# Patient Record
Sex: Female | Born: 1975 | Race: White | Hispanic: No | Smoking: Current every day smoker
Health system: Southern US, Community
[De-identification: ages and names within clinical notes are randomized; demographics above are authoritative.]

## PROBLEM LIST (undated history)

## (undated) DIAGNOSIS — M549 Dorsalgia, unspecified: Secondary | ICD-10-CM

## (undated) DIAGNOSIS — R569 Unspecified convulsions: Secondary | ICD-10-CM

## (undated) DIAGNOSIS — Z91148 Patient's other noncompliance with medication regimen for other reason: Secondary | ICD-10-CM

## (undated) DIAGNOSIS — Z9114 Patient's other noncompliance with medication regimen: Secondary | ICD-10-CM

## (undated) DIAGNOSIS — G8929 Other chronic pain: Secondary | ICD-10-CM

## (undated) DIAGNOSIS — Q282 Arteriovenous malformation of cerebral vessels: Secondary | ICD-10-CM

## (undated) DIAGNOSIS — C801 Malignant (primary) neoplasm, unspecified: Secondary | ICD-10-CM

## (undated) HISTORY — PX: TUBAL LIGATION: SHX77

## (undated) HISTORY — PX: ABDOMINAL HYSTERECTOMY: SHX81

## (undated) HISTORY — PX: BRAIN SURGERY: SHX531

---

## 2000-09-10 ENCOUNTER — Encounter: Payer: Self-pay | Admitting: Emergency Medicine

## 2000-09-10 ENCOUNTER — Emergency Department (HOSPITAL_COMMUNITY): Admission: EM | Admit: 2000-09-10 | Discharge: 2000-09-10 | Payer: Self-pay | Admitting: Emergency Medicine

## 2001-03-03 ENCOUNTER — Emergency Department (HOSPITAL_COMMUNITY): Admission: EM | Admit: 2001-03-03 | Discharge: 2001-03-03 | Payer: Self-pay | Admitting: *Deleted

## 2001-04-01 ENCOUNTER — Emergency Department (HOSPITAL_COMMUNITY): Admission: EM | Admit: 2001-04-01 | Discharge: 2001-04-01 | Payer: Self-pay | Admitting: Emergency Medicine

## 2001-10-22 ENCOUNTER — Emergency Department (HOSPITAL_COMMUNITY): Admission: EM | Admit: 2001-10-22 | Discharge: 2001-10-22 | Payer: Self-pay | Admitting: Emergency Medicine

## 2002-02-14 ENCOUNTER — Emergency Department (HOSPITAL_COMMUNITY): Admission: EM | Admit: 2002-02-14 | Discharge: 2002-02-14 | Payer: Self-pay | Admitting: Emergency Medicine

## 2002-02-16 ENCOUNTER — Encounter: Payer: Self-pay | Admitting: Emergency Medicine

## 2002-02-17 ENCOUNTER — Encounter: Payer: Self-pay | Admitting: Internal Medicine

## 2002-02-17 ENCOUNTER — Observation Stay (HOSPITAL_COMMUNITY): Admission: EM | Admit: 2002-02-17 | Discharge: 2002-02-18 | Payer: Self-pay | Admitting: Emergency Medicine

## 2002-07-05 ENCOUNTER — Emergency Department (HOSPITAL_COMMUNITY): Admission: EM | Admit: 2002-07-05 | Discharge: 2002-07-06 | Payer: Self-pay | Admitting: *Deleted

## 2002-07-06 ENCOUNTER — Encounter: Payer: Self-pay | Admitting: *Deleted

## 2002-10-04 ENCOUNTER — Observation Stay (HOSPITAL_COMMUNITY): Admission: EM | Admit: 2002-10-04 | Discharge: 2002-10-04 | Payer: Self-pay | Admitting: Emergency Medicine

## 2003-03-27 ENCOUNTER — Emergency Department (HOSPITAL_COMMUNITY): Admission: EM | Admit: 2003-03-27 | Discharge: 2003-03-27 | Payer: Self-pay | Admitting: *Deleted

## 2003-05-26 ENCOUNTER — Emergency Department (HOSPITAL_COMMUNITY): Admission: EM | Admit: 2003-05-26 | Discharge: 2003-05-26 | Payer: Self-pay | Admitting: Emergency Medicine

## 2003-05-26 ENCOUNTER — Emergency Department (HOSPITAL_COMMUNITY): Admission: EM | Admit: 2003-05-26 | Discharge: 2003-05-26 | Payer: Self-pay | Admitting: *Deleted

## 2003-07-13 ENCOUNTER — Emergency Department (HOSPITAL_COMMUNITY): Admission: EM | Admit: 2003-07-13 | Discharge: 2003-07-13 | Payer: Self-pay | Admitting: Emergency Medicine

## 2005-06-16 ENCOUNTER — Emergency Department (HOSPITAL_COMMUNITY): Admission: EM | Admit: 2005-06-16 | Discharge: 2005-06-16 | Payer: Self-pay | Admitting: Emergency Medicine

## 2005-07-03 ENCOUNTER — Emergency Department (HOSPITAL_COMMUNITY): Admission: EM | Admit: 2005-07-03 | Discharge: 2005-07-03 | Payer: Self-pay | Admitting: Emergency Medicine

## 2005-11-10 ENCOUNTER — Emergency Department (HOSPITAL_COMMUNITY): Admission: EM | Admit: 2005-11-10 | Discharge: 2005-11-10 | Payer: Self-pay | Admitting: Emergency Medicine

## 2006-01-09 ENCOUNTER — Emergency Department (HOSPITAL_COMMUNITY): Admission: EM | Admit: 2006-01-09 | Discharge: 2006-01-09 | Payer: Self-pay | Admitting: Emergency Medicine

## 2006-01-20 ENCOUNTER — Inpatient Hospital Stay (HOSPITAL_COMMUNITY): Admission: EM | Admit: 2006-01-20 | Discharge: 2006-01-23 | Payer: Self-pay | Admitting: Emergency Medicine

## 2006-02-24 ENCOUNTER — Emergency Department (HOSPITAL_COMMUNITY): Admission: EM | Admit: 2006-02-24 | Discharge: 2006-02-24 | Payer: Self-pay | Admitting: Emergency Medicine

## 2006-03-07 ENCOUNTER — Emergency Department (HOSPITAL_COMMUNITY): Admission: EM | Admit: 2006-03-07 | Discharge: 2006-03-07 | Payer: Self-pay | Admitting: Emergency Medicine

## 2006-03-08 ENCOUNTER — Emergency Department (HOSPITAL_COMMUNITY): Admission: EM | Admit: 2006-03-08 | Discharge: 2006-03-08 | Payer: Self-pay | Admitting: Emergency Medicine

## 2006-04-02 ENCOUNTER — Emergency Department (HOSPITAL_COMMUNITY): Admission: EM | Admit: 2006-04-02 | Discharge: 2006-04-02 | Payer: Self-pay | Admitting: Emergency Medicine

## 2006-04-25 ENCOUNTER — Emergency Department (HOSPITAL_COMMUNITY): Admission: EM | Admit: 2006-04-25 | Discharge: 2006-04-25 | Payer: Self-pay | Admitting: Emergency Medicine

## 2006-05-04 ENCOUNTER — Emergency Department (HOSPITAL_COMMUNITY): Admission: EM | Admit: 2006-05-04 | Discharge: 2006-05-04 | Payer: Self-pay | Admitting: Emergency Medicine

## 2006-05-20 ENCOUNTER — Emergency Department (HOSPITAL_COMMUNITY): Admission: EM | Admit: 2006-05-20 | Discharge: 2006-05-20 | Payer: Self-pay | Admitting: Emergency Medicine

## 2006-06-09 ENCOUNTER — Emergency Department (HOSPITAL_COMMUNITY): Admission: EM | Admit: 2006-06-09 | Discharge: 2006-06-09 | Payer: Self-pay | Admitting: Emergency Medicine

## 2006-06-25 ENCOUNTER — Emergency Department (HOSPITAL_COMMUNITY): Admission: EM | Admit: 2006-06-25 | Discharge: 2006-06-25 | Payer: Self-pay | Admitting: Emergency Medicine

## 2006-07-06 ENCOUNTER — Emergency Department (HOSPITAL_COMMUNITY): Admission: EM | Admit: 2006-07-06 | Discharge: 2006-07-07 | Payer: Self-pay | Admitting: Emergency Medicine

## 2006-07-14 ENCOUNTER — Emergency Department (HOSPITAL_COMMUNITY): Admission: EM | Admit: 2006-07-14 | Discharge: 2006-07-14 | Payer: Self-pay | Admitting: Emergency Medicine

## 2006-09-18 ENCOUNTER — Emergency Department (HOSPITAL_COMMUNITY): Admission: EM | Admit: 2006-09-18 | Discharge: 2006-09-18 | Payer: Self-pay | Admitting: Emergency Medicine

## 2006-11-08 ENCOUNTER — Emergency Department (HOSPITAL_COMMUNITY): Admission: EM | Admit: 2006-11-08 | Discharge: 2006-11-08 | Payer: Self-pay | Admitting: Emergency Medicine

## 2006-12-08 ENCOUNTER — Emergency Department (HOSPITAL_COMMUNITY): Admission: EM | Admit: 2006-12-08 | Discharge: 2006-12-08 | Payer: Self-pay | Admitting: Emergency Medicine

## 2006-12-20 ENCOUNTER — Emergency Department (HOSPITAL_COMMUNITY): Admission: EM | Admit: 2006-12-20 | Discharge: 2006-12-20 | Payer: Self-pay | Admitting: Emergency Medicine

## 2007-01-07 ENCOUNTER — Emergency Department (HOSPITAL_COMMUNITY): Admission: EM | Admit: 2007-01-07 | Discharge: 2007-01-07 | Payer: Self-pay | Admitting: Emergency Medicine

## 2007-02-06 ENCOUNTER — Emergency Department (HOSPITAL_COMMUNITY): Admission: EM | Admit: 2007-02-06 | Discharge: 2007-02-06 | Payer: Self-pay | Admitting: Emergency Medicine

## 2007-02-15 ENCOUNTER — Emergency Department (HOSPITAL_COMMUNITY): Admission: EM | Admit: 2007-02-15 | Discharge: 2007-02-15 | Payer: Self-pay | Admitting: Emergency Medicine

## 2007-02-28 ENCOUNTER — Emergency Department (HOSPITAL_COMMUNITY): Admission: EM | Admit: 2007-02-28 | Discharge: 2007-02-28 | Payer: Self-pay | Admitting: Emergency Medicine

## 2007-03-24 ENCOUNTER — Emergency Department (HOSPITAL_COMMUNITY): Admission: EM | Admit: 2007-03-24 | Discharge: 2007-03-24 | Payer: Self-pay | Admitting: Emergency Medicine

## 2007-05-10 ENCOUNTER — Emergency Department (HOSPITAL_COMMUNITY): Admission: EM | Admit: 2007-05-10 | Discharge: 2007-05-10 | Payer: Self-pay | Admitting: Emergency Medicine

## 2007-11-27 ENCOUNTER — Emergency Department (HOSPITAL_COMMUNITY): Admission: EM | Admit: 2007-11-27 | Discharge: 2007-11-28 | Payer: Self-pay | Admitting: Emergency Medicine

## 2008-01-01 ENCOUNTER — Emergency Department (HOSPITAL_COMMUNITY): Admission: EM | Admit: 2008-01-01 | Discharge: 2008-01-01 | Payer: Self-pay | Admitting: Emergency Medicine

## 2008-01-27 ENCOUNTER — Emergency Department (HOSPITAL_COMMUNITY): Admission: EM | Admit: 2008-01-27 | Discharge: 2008-01-27 | Payer: Self-pay | Admitting: Emergency Medicine

## 2008-02-07 ENCOUNTER — Emergency Department (HOSPITAL_COMMUNITY): Admission: EM | Admit: 2008-02-07 | Discharge: 2008-02-07 | Payer: Self-pay | Admitting: Emergency Medicine

## 2008-03-05 ENCOUNTER — Emergency Department (HOSPITAL_COMMUNITY): Admission: EM | Admit: 2008-03-05 | Discharge: 2008-03-05 | Payer: Self-pay | Admitting: Emergency Medicine

## 2008-03-09 ENCOUNTER — Emergency Department (HOSPITAL_COMMUNITY): Admission: EM | Admit: 2008-03-09 | Discharge: 2008-03-09 | Payer: Self-pay | Admitting: Emergency Medicine

## 2008-03-11 ENCOUNTER — Emergency Department (HOSPITAL_COMMUNITY): Admission: EM | Admit: 2008-03-11 | Discharge: 2008-03-11 | Payer: Self-pay | Admitting: Emergency Medicine

## 2008-04-08 ENCOUNTER — Emergency Department (HOSPITAL_COMMUNITY): Admission: EM | Admit: 2008-04-08 | Discharge: 2008-04-08 | Payer: Self-pay | Admitting: Emergency Medicine

## 2008-07-19 ENCOUNTER — Emergency Department (HOSPITAL_COMMUNITY): Admission: EM | Admit: 2008-07-19 | Discharge: 2008-07-19 | Payer: Self-pay | Admitting: Emergency Medicine

## 2008-11-20 ENCOUNTER — Emergency Department (HOSPITAL_COMMUNITY): Admission: EM | Admit: 2008-11-20 | Discharge: 2008-11-20 | Payer: Self-pay | Admitting: Emergency Medicine

## 2008-12-26 ENCOUNTER — Emergency Department (HOSPITAL_COMMUNITY): Admission: EM | Admit: 2008-12-26 | Discharge: 2008-12-26 | Payer: Self-pay | Admitting: Emergency Medicine

## 2008-12-28 ENCOUNTER — Emergency Department (HOSPITAL_COMMUNITY): Admission: EM | Admit: 2008-12-28 | Discharge: 2008-12-28 | Payer: Self-pay | Admitting: Emergency Medicine

## 2009-01-08 ENCOUNTER — Emergency Department (HOSPITAL_COMMUNITY): Admission: EM | Admit: 2009-01-08 | Discharge: 2009-01-08 | Payer: Self-pay | Admitting: Emergency Medicine

## 2009-05-02 ENCOUNTER — Emergency Department (HOSPITAL_COMMUNITY): Admission: EM | Admit: 2009-05-02 | Discharge: 2009-05-02 | Payer: Self-pay | Admitting: Emergency Medicine

## 2009-09-29 ENCOUNTER — Emergency Department (HOSPITAL_COMMUNITY): Admission: EM | Admit: 2009-09-29 | Discharge: 2009-09-29 | Payer: Self-pay | Admitting: Emergency Medicine

## 2009-11-07 ENCOUNTER — Emergency Department (HOSPITAL_COMMUNITY): Admission: EM | Admit: 2009-11-07 | Discharge: 2009-11-07 | Payer: Self-pay | Admitting: Emergency Medicine

## 2009-12-26 ENCOUNTER — Emergency Department (HOSPITAL_COMMUNITY): Admission: EM | Admit: 2009-12-26 | Discharge: 2009-12-26 | Payer: Self-pay | Admitting: Emergency Medicine

## 2010-03-27 ENCOUNTER — Emergency Department (HOSPITAL_COMMUNITY): Admission: EM | Admit: 2010-03-27 | Discharge: 2010-03-27 | Payer: Self-pay | Admitting: Emergency Medicine

## 2010-07-10 LAB — CBC
Hemoglobin: 14.8 g/dL (ref 12.0–15.0)
MCH: 35.2 pg — ABNORMAL HIGH (ref 26.0–34.0)
MCHC: 36.1 g/dL — ABNORMAL HIGH (ref 30.0–36.0)
MCV: 97.4 fL (ref 78.0–100.0)
RBC: 4.21 MIL/uL (ref 3.87–5.11)

## 2010-07-10 LAB — DIFFERENTIAL
Basophils Relative: 1 % (ref 0–1)
Lymphocytes Relative: 21 % (ref 12–46)
Lymphs Abs: 1.5 10*3/uL (ref 0.7–4.0)
Monocytes Relative: 10 % (ref 3–12)
Neutro Abs: 4.6 10*3/uL (ref 1.7–7.7)
Neutrophils Relative %: 66 % (ref 43–77)

## 2010-07-10 LAB — POCT I-STAT, CHEM 8
Chloride: 106 mEq/L (ref 96–112)
Creatinine, Ser: 1 mg/dL (ref 0.4–1.2)
Hemoglobin: 15 g/dL (ref 12.0–15.0)
Potassium: 4.8 mEq/L (ref 3.5–5.1)
Sodium: 138 mEq/L (ref 135–145)

## 2010-07-10 LAB — URINALYSIS, ROUTINE W REFLEX MICROSCOPIC
Bilirubin Urine: NEGATIVE
Hgb urine dipstick: NEGATIVE
Ketones, ur: NEGATIVE mg/dL
Specific Gravity, Urine: 1.015 (ref 1.005–1.030)
Urobilinogen, UA: 0.2 mg/dL (ref 0.0–1.0)

## 2010-07-11 LAB — URINE CULTURE

## 2010-07-11 LAB — DIFFERENTIAL
Basophils Absolute: 0 10*3/uL (ref 0.0–0.1)
Basophils Relative: 0 % (ref 0–1)
Eosinophils Relative: 2 % (ref 0–5)
Monocytes Absolute: 0.8 10*3/uL (ref 0.1–1.0)

## 2010-07-11 LAB — CBC
HCT: 40.4 % (ref 36.0–46.0)
MCHC: 33.9 g/dL (ref 30.0–36.0)
MCV: 102.7 fL — ABNORMAL HIGH (ref 78.0–100.0)
RDW: 17.5 % — ABNORMAL HIGH (ref 11.5–15.5)

## 2010-07-11 LAB — URINALYSIS, ROUTINE W REFLEX MICROSCOPIC
Ketones, ur: NEGATIVE mg/dL
Nitrite: NEGATIVE
Protein, ur: NEGATIVE mg/dL
pH: 5.5 (ref 5.0–8.0)

## 2010-07-11 LAB — RAPID URINE DRUG SCREEN, HOSP PERFORMED
Amphetamines: NOT DETECTED
Benzodiazepines: POSITIVE — AB

## 2010-07-11 LAB — BASIC METABOLIC PANEL
BUN: 11 mg/dL (ref 6–23)
CO2: 27 mEq/L (ref 19–32)
Chloride: 103 mEq/L (ref 96–112)
Glucose, Bld: 113 mg/dL — ABNORMAL HIGH (ref 70–99)
Potassium: 3.6 mEq/L (ref 3.5–5.1)

## 2010-07-11 LAB — URINE MICROSCOPIC-ADD ON

## 2010-07-11 LAB — PREGNANCY, URINE: Preg Test, Ur: NEGATIVE

## 2010-07-27 ENCOUNTER — Emergency Department (HOSPITAL_COMMUNITY): Payer: Medicaid Other

## 2010-07-27 ENCOUNTER — Emergency Department (HOSPITAL_COMMUNITY)
Admission: EM | Admit: 2010-07-27 | Discharge: 2010-07-27 | Disposition: A | Discharge status: Home / Self Care | Payer: Medicaid Other | Attending: Emergency Medicine | Admitting: Emergency Medicine

## 2010-07-27 DIAGNOSIS — R569 Unspecified convulsions: Secondary | ICD-10-CM | POA: Insufficient documentation

## 2010-07-27 DIAGNOSIS — IMO0002 Reserved for concepts with insufficient information to code with codable children: Secondary | ICD-10-CM | POA: Insufficient documentation

## 2010-07-27 DIAGNOSIS — G40909 Epilepsy, unspecified, not intractable, without status epilepticus: Secondary | ICD-10-CM | POA: Insufficient documentation

## 2010-07-27 DIAGNOSIS — W1809XA Striking against other object with subsequent fall, initial encounter: Secondary | ICD-10-CM | POA: Insufficient documentation

## 2010-07-27 DIAGNOSIS — Y92009 Unspecified place in unspecified non-institutional (private) residence as the place of occurrence of the external cause: Secondary | ICD-10-CM | POA: Insufficient documentation

## 2010-07-27 DIAGNOSIS — S335XXA Sprain of ligaments of lumbar spine, initial encounter: Secondary | ICD-10-CM | POA: Insufficient documentation

## 2010-07-27 LAB — BASIC METABOLIC PANEL
BUN: 8 mg/dL (ref 6–23)
CO2: 27 mEq/L (ref 19–32)
Calcium: 9.2 mg/dL (ref 8.4–10.5)
Creatinine, Ser: 1.06 mg/dL (ref 0.4–1.2)
GFR calc Af Amer: >60 mL/min (ref >60)
Glucose, Bld: 91 mg/dL (ref 70–99)

## 2010-07-27 LAB — DIFFERENTIAL
Basophils Relative: 0 % (ref 0–1)
Lymphs Abs: 1.5 10*3/uL (ref 0.7–4.0)
Monocytes Absolute: 1 10*3/uL (ref 0.1–1.0)
Monocytes Relative: 9 % (ref 3–12)
Neutro Abs: 9 10*3/uL — ABNORMAL HIGH (ref 1.7–7.7)

## 2010-07-27 LAB — CBC
Hemoglobin: 15.3 g/dL — ABNORMAL HIGH (ref 12.0–15.0)
MCH: 34.8 pg — ABNORMAL HIGH (ref 26.0–34.0)
MCHC: 35 g/dL (ref 30.0–36.0)
MCV: 99.3 fL (ref 78.0–100.0)

## 2010-08-04 LAB — VALPROIC ACID LEVEL: Valproic Acid Lvl: 83 ug/mL (ref 50.0–100.0)

## 2010-08-30 ENCOUNTER — Emergency Department (HOSPITAL_COMMUNITY)
Admission: EM | Admit: 2010-08-30 | Discharge: 2010-08-30 | Disposition: A | Discharge status: Home / Self Care | Payer: Medicaid Other | Attending: Emergency Medicine | Admitting: Emergency Medicine

## 2010-08-30 DIAGNOSIS — M549 Dorsalgia, unspecified: Secondary | ICD-10-CM | POA: Insufficient documentation

## 2010-08-30 DIAGNOSIS — R569 Unspecified convulsions: Secondary | ICD-10-CM | POA: Insufficient documentation

## 2010-09-13 ENCOUNTER — Emergency Department (HOSPITAL_COMMUNITY): Payer: Medicaid Other

## 2010-09-13 ENCOUNTER — Emergency Department (HOSPITAL_COMMUNITY)
Admission: EM | Admit: 2010-09-13 | Discharge: 2010-09-13 | Disposition: A | Discharge status: Home / Self Care | Payer: Medicaid Other | Attending: Emergency Medicine | Admitting: Emergency Medicine

## 2010-09-13 DIAGNOSIS — R569 Unspecified convulsions: Secondary | ICD-10-CM | POA: Insufficient documentation

## 2010-09-13 DIAGNOSIS — M549 Dorsalgia, unspecified: Secondary | ICD-10-CM | POA: Insufficient documentation

## 2010-09-13 DIAGNOSIS — G8929 Other chronic pain: Secondary | ICD-10-CM | POA: Insufficient documentation

## 2010-09-13 NOTE — H&P (Signed)
NAMELASHANN, HAGG                 ACCOUNT NO.:  [id number]   MEDICAL RECORD NO.:  [id number]          PATIENT TYPE:  INP   LOCATION:  IC09                          FACILITY:  [facility]   PHYSICIAN:  Osvaldo Shipper, MD     DATE OF BIRTH:  1975-11-25   DATE OF ADMISSION:  01/20/2006  DATE OF DISCHARGE:  LH                                HISTORY & PHYSICAL   The patient does not have a primary medical doctor.   ADMITTING DIAGNOSES:  1. Valproic acid toxicity.  2. Drug overdose, likely intentional.  3. History of suicidal attempts in the past.  4. History of seizure disorder.   CHIEF COMPLAINT:  Unresponsiveness.   HISTORY OF PRESENT ILLNESS:  The patient is a 35 year old Caucasian female  who has a history of seizure disorder and a history of multiple episodes of  suicidal attempts in the past for which she has required inpatient  psychiatric therapy.  She also carries a diagnosis of depression and seizure  disorder.  The patient is very somnolent, unable to give me any history at  this time.   According to the ED physician, the patient took valproic acid yesterday, the  exact quantity is unknown.  This morning she was found by her boyfriend to  be lethargic and somnolent and acting strangely.  According to the  boyfriend, she might have also had a seizure though he is not very sure.  The patient was subsequently brought into the ED for further evaluation.   The patient was unable to give my any history whatsoever at this time.   HOME MEDICATIONS:  Depakote, unknown dose and Tylenol as needed.   ALLERGIES:  NO KNOWN DRUG ALLERGIES.   PAST MEDICAL HISTORY:  Obtained from previous records and boyfriend  1. History of depression with suicidal attempts in the past.  2. History of anxiety disorder.  3. History of seizure disorder for which she is on Depakote.   SURGICAL HISTORY:  Apparently includes tubal ligation, otherwise none is  mentioned in the chart.   SOCIAL HISTORY:   Lives with her boyfriend.  The rest of the social history  is unable to be obtained.   FAMILY HISTORY:  Unable to be done at this time.   REVIEW OF SYSTEMS:  Unable to be done at this time.   PHYSICAL EXAMINATION:  VITAL SIGNS:  Temperature is 98.1, blood pressure is  120/64, heart rate in the 70s, respiratory rate is about 18, saturation 97%  on room air.  GENERAL:  This is an obese, overweight white female, very somnolent, arouses  on calling her name but then goes right back to sleep.  HEENT:  There is no pallor or icterus.  Oral mucous membranes are moist.  No  oral lesions are noted.  Pupils are equal, reactive to light.  NECK:  Soft and supple.  LUNGS:  Clear to auscultation anteriorly.  CARDIOVASCULAR:  S1 S2 is normal regular.  No murmurs appreciated.  ABDOMEN:  Obese, nontender, nondistended.  EXTREMITIES:  Without edema.  Peripheral pulses are palpable.  NEUROLOGIC:  No focal  neurologic deficits are appreciated.  She did manage  to put her tongue out which was in the midline.   LABORATORY DATA:  Her CBC shows a hemoglobin of 11.3, MCV is 83, platelet  count is 143, other parameters are normal.  B-MET is normal.  LFTs are  unremarkable.  Albumin is 3.1.  Calcium is 8.3.  Urine pregnancy test is  negative.  Acetaminophen level is 53.  Valproic acid level is 446.  Urine  drug screen is positive for cocaine.  Alcohol level less than 5.   No admitting studies have been done.   IMPRESSION:  This is a 35 year old Caucasian female with medical problems as  stated above with a history of suicidal attempts in the past who presents  with a drug overdose and found to have a toxic valproic acid level.  Till  otherwise known, we will treat this as a possible suicidal attempt.  She  also has a slightly high acetaminophen level.  According to the patient's  boyfriend, the patient does not have a lot of Tylenol at home and he does  not think that she took a lot of Tylenol.  Her LFTs are  not elevated at this  time.   PLAN:  1. Valproic acid toxicity likely intentional.  Most of the treatments      supportive.  We will check an ammonia level.  We will monitor the      patient's neurologic status closely in the intensive care unit.      Patients who have toxic valproic acid level are prone to cerebral edema      which we will keep in our mind at this time.  As of now, the patient      does not seem to have any focal deficits and she is able to awake on      calling her name but then she goes right back to sleep.  Valproic acid      level will be monitored closely.  2. Elevated acetaminophen level.  We will check on the level now.  We will      monitor LFTs and acetaminophen level in the morning as well.  I do not      think that this level represents an acute Tylenol toxicity.  If her      LFTs trend upward, we will consider treatment with Mucomyst.  3. History of seizure disorder.  We will continue to monitor the patient      in the ICU.  4. Once the patient is medically stable, she will need to be evaluated at      the behavioral health center.      Osvaldo Shipper, MD  Electronically Signed     GK/MEDQ  D:  01/20/2006  T:  01/20/2006  Job:  [id number]

## 2010-09-13 NOTE — Discharge Summary (Signed)
[name], [name]                           ACCOUNT NO.:  [id number]   MEDICAL RECORD NO.:  [id number]                   PATIENT TYPE:  OBV   LOCATION:  A213                                 FACILITY:  [facility]   PHYSICIAN:  [name], M.D.           DATE OF BIRTH:  [birth year]   DATE OF ADMISSION:  02/16/2002  DATE OF DISCHARGE:  02/18/2002                                 DISCHARGE SUMMARY   DISCHARGE DIAGNOSES:  1. Chest pain, gastrointestinal related.     a. Erosive esophagitis.  2. Schatzki's ring not manipulated.  3. Normal gallbladder.  4. Anemia.  5. Severe iron deficiency with ferratin of 6. Iron studies less than 10.     Possible acute superimposed on chronic iron deficiency.  6. Polysubstance abuse.  7. Symptomatic left nephrolithiasis on ultrasound.   DISPOSITION:  The patient discharged to home after proceeding directly to  mental health emergency services for intensive outpatient rehab.   MEDICATIONS:  1. Iron 325 mg b.i.d.  2. Aciphex 20 mg daily.  3. Hydrocodone/APAP 5/500 t.i.d. p.r.n.  The patient  is given a     prescription for #10 with no refills.  The patient  instructed not to     take any Aleve, Advil, aspirin or any other over-the-counter pain     medications aside from Tylenol.  The patient  strongly encouraged to     proceed with mental health for polysubstance abuse.  4. Multivitamin with minerals one daily such as Theragran N.   FOLLOW UP:  The patient  has [facility] and assigned to [address].  Arrangements made to follow up with Dr. Donnalee [name].  Follow up with Dr. Benard [name] in six to eight weeks for possible colonoscopy to  complete GI workup.  Followup with mental health as noted above.   HOSPITAL COURSE:  The patient  is a 35 year old healthy female with a  history of polysubstance abuse who presented to the emergency room  with  chest pain.  On further evaluation it was found that this was most likely GI  related by  history.  Though her chest pain resolved, she complained of  intermittent right upper quadrant pain and ultrasound revealed no findings  with the gallbladder.  She did have an asymptomatic left kidney stone of 8  mm at the upper pole of the kidney which was asymptomatic.  The patient  was  endoscoped on 10/24 per Dr. Benard [name] uneventfully.  Findings revealed erosive  esophagitis.  No frank ulceration and a Schatzki's ring.  There was no  evidence of Barrett's esophagus.  She had a patent pylorus and the duodenum  was okay.  She did have some retained food in her stomach which made the  exam somewhat difficult, but no frank ulcers were seen.  The patient was  noted to be anemic with hemoglobin of 8.1 at the time of admission with an  MCV of 62.  Iron studies subsequently revealed severe iron deficiency and  though her hemoglobin dropped to 7.1 post hydration, she did not have any  evidence of decompensation. It was felt that she could be discharged safely  home without transfusion on iron therapy.  This was discussed with patient  and her mother.  The mother seems to have a reasonable understanding.  The  patient  had very limited insight despite multiple attempts to explain the  findings and concerns that were noted during this study.  Followup is  arranged as above.  At the time of discharge awake, alert female in no  distress.  Blood pressure  is 123/56, pulse is 74 and regular.  Respirations  are unlabored and lungs are clear to auscultation.  Has a soft nontender no  mass.  Bowel sounds are present.  Extremities without clubbing, cyanosis or  edema.  Neurologic status was nonfocal.  She has somewhat unusual affect,  but certainly does not appear to be compromised.   ASSESSMENT/PLAN:  As noted above.                                               [name], M.D.    FED/MEDQ  D:  02/18/2002  T:  02/19/2002  Job:  [id number]

## 2010-09-13 NOTE — Discharge Summary (Signed)
   [name], [name]                           ACCOUNT NO.:  [id number]   MEDICAL RECORD NO.:  [id number]                   PATIENT TYPE:  OBV   LOCATION:  IC03                                 FACILITY:  [facility]   PHYSICIAN:  Sarita Bottom, M.D.                  DATE OF BIRTH:  01-02-1976   DATE OF ADMISSION:  10/04/2002  DATE OF DISCHARGE:  10/04/2002                                 DISCHARGE SUMMARY   This 35 year old lady with a history of depression. She was admitted this  morning to [facility] after she reportedly took 14 pills of Paxil  in an attempt to calm herself and commit suicide.  The patient was admitted  to the [facility] intensive care unit for observation.  Her  clinical course was essentially unremarkable.  _______was called to see the  patient and after evaluation they recommended that the patient could be  transferred to [facility] Psychiatric Department.   The patient's physical examination was essentially unremarkable at the time  of discharge.   The patient will therefore be discharged to [facility]  Psychiatric Department.  For details of patient's illness refer to patient's  H&P dictated area.  The patient's discharge condition is stable and  satisfactory.                                               Sarita Bottom, M.D.    DW/MEDQ  D:  10/04/2002  T:  10/05/2002  Job:  [id number]

## 2010-09-13 NOTE — Discharge Summary (Signed)
[name], [name]                 ACCOUNT NO.:  [id number]   MEDICAL RECORD NO.:  [id number]          PATIENT TYPE:  INP   LOCATION:  A211                          FACILITY:  [facility]   PHYSICIAN:  Mobolaji B. Bakare, M.D.DATE OF BIRTH:  05-23-1975   DATE OF ADMISSION:  01/20/2006  DATE OF DISCHARGE:  09/28/2007LH                                 DISCHARGE SUMMARY   PRIMARY CARE PHYSICIAN:  Unassigned psychiatrist.   FINAL DIAGNOSES:  1. Valproic acid toxicity.  2. Probable suicidal attempt.  3. History of seizure disorder.  4. Cocaine abuse.   BRIEF HISTORY:  [name] is a 35 year old Caucasian female with history of  seizures and mental illness.  She has attempted suicide in the past.  She  presented to the Emergency Room unresponsive on the day of admission. We  could gather that the patient took valproic acid and quantity was unknown.  When the patient became more alert and awake she stated that she did not  intend to kill herself and she forgot she had used the medication.  She was  found by her boyfriend to be lethargic and acting strangely.  He thought she  might have had a seizure.  He subsequently brought her to the Emergency  Room.  On initial evaluation, the patient's vital signs were 98, temperature  98.1, blood pressure 120/65, heart rate of 70, O2 sat of 97%.  The patient  was without neurological deficit.  She was able to respond to obey commands  minimally, however she was very somnolent but arousable.  Initial laboratory  data revealed valproic acid of 446.  We tried four times (more than four  times upper limits of normal).  She had urine drug screen which was also  positive for cocaine.  Alcohol level was 5.   The patient was admitted to the intensive care unit and was monitored.  She  was also noted to have acetaminophen level of 53.   HOSPITAL COURSE:  As the patient was monitored closely, valproic acid level  was monitored and this trended downwards.  By the  third day of admission,  valproic acid was less than 50.  The patient was more awake, alert and  wanted to go home.  She was earlier seen during admission by the ACT team  and the disposition was to admit to [facility].  Subsequent  evaluation revealed that the patient had capacity to make decisions and she  is willing to follow-up at Mental Health Unit.  She is no more at risk to  herself or to anybody.  The patient again reported to the ACT team that she  had been drinking the night that she took the medication so she could not  remember how many Depakote she took.  She stated she has no reason to harm  herself.  She is found not to be psychotic.  The patient has an appointment  at [facility] on the 25th of October 2007 at 8:30 a.m.  She was admonished to follow-up with this appointment.   LAB WORK:  1.  The patient was started back on valproic acid and she had to take an      extra dose on the day of discharge.  Follow-up valproic acid was 81      which is within therapeutic range.  She is stable for discharge to      follow-up with mental health physicians.  2. Tylenol overdose.  The patient follow-up level of Tylenol was less than      10.  She did not require any acetylcysteine.  3. Seizure disorder.  The patient did not have any seizure during this      hospitalization.   PROCEDURE:  Head CT scan showed punctate areas of calcification noted in the  left temporal lobe.  This has remained stable over several years.   DISCHARGE MEDICATIONS:  Depakote 500 mg three tablets nightly, Lortab  7.5/500 p.o. q. 4 to 6 hours p.r.n..  The patient was taking this before at  home.  There was no prescription written for her.      Mobolaji B. Corky Downs, M.D.  Electronically Signed     MBB/MEDQ  D:  01/23/2006  T:  01/26/2006  Job:  [id number]

## 2010-09-13 NOTE — H&P (Signed)
NAME:  [name], [name] NO.:  [id number]   MEDICAL RECORD NO.:  [id number]                   PATIENT TYPE:   LOCATION:                                       FACILITY:   PHYSICIAN:  Sarita Bottom, M.D.                  DATE OF BIRTH:   DATE OF ADMISSION:  DATE OF DISCHARGE:                                HISTORY & PHYSICAL   PRIMARY MD:  Dr. Lodema Hong.   CHIEF COMPLAINT:  I took an overdose.   HISTORY OF PRESENT ILLNESS:  [name] is a 35 year old lady with a  history of depression and previous suicide attempt recently started on Paxil  for depression.  Patient took 14 pills of Paxil this morning around 4 a.m.  after which her companion called the police who had to struggle with the  patient to bring her to the emergency room.  In the emergency room the  patient was treated with activated charcoal.  She admits to trying to commit  suicide by taking these tablets of Paxil and she said this event was  precipitated by recent stressful events in her life.   REVIEW OF SYSTEMS:  She denies any fever, recent weight loss.  RESPIRATORY:  She admits to cough off and on.  She complains of shortness of breath.  She  admits to palpitations off and on.  Admits to bilateral chest pain.  Denies  any diarrhea.  She complains of vomiting once this morning.  Denies any  dizziness or headache.  All other systems reviewed and they are negative.   PAST MEDICAL HISTORY:  1. Significant for depression.  2. She has a history of suicide attempts in the past.  3. She has a history of anxiety disorder.  4. She has a history of tubal ligation.   CURRENT MEDICATIONS:  1. Include Paxil.  2. Xanax.   ALLERGIES:  She claims to be allergic to PENICILLIN but she is not sure what  her reaction.   FAMILY HISTORY:  Her Dad is on hemodialysis.  There is no family history of  depression.   SOCIAL HISTORY:  She is separated.  She currently lives with a companion who  she  says is an older man, sugar daddy.  She has two kids who are currently  with her sister.  She smokes about one half pack of cigarettes per day.  Does not drink alcohol but uses crack cocaine.  She is unemployed.   PHYSICAL EXAMINATION:  VITAL SIGNS:  Blood pressure 148/98, heart rate 90,  temperature 97.2 degrees Farenheit.  GENERAL:  This is a young lady, small stature, she looks slightly groggy.  HEENT:  She is not pale.  She is anicteric.  Her oral mucosa is moist.  Her  tongue is pierced.  NECK:  She has a scar on the right lateral aspect of her neck.  No  jugular  venous distention or lymphadenopathy or thyromegaly.  CHEST:  Clear to auscultation.  CV:  Heart sounds S1 and S2 are normal.  Rhythm is regular.  No murmurs were  heard.  ABDOMEN:  Benign.  CNS:  She is awake.  Appears slightly groggy.  She has no focal neurologic  deficit.  She is well oriented to name, place and person.  EXTREMITIES:  She has no edema.   LAB DIAGNOSTIC:  Salicylate level is less than 4.0.  Acetaminophen level is  less than 10.  Alcohol level is less than 5.  Sodium is 134, potassium is  3.6, chloride is 106, CO2 is 27, BUN is 10, creatinine is 0.9, glucose is  115.  Urine drug screen is positive for cocaine and cannabinoids.  Hemoglobin is 13.7, WBC 7.5, MCV is 80.8 , platelet count is 150.   ASSESSMENT/PLAN:  1. Acute drug overdose.  Patient will be admitted to intensive care unit for     observation and monitoring.  She was put on a one-to-one  observation.  She will be given oxygen via nasal cannula at 2 L per minute.  1. Depression with suicidal ideation.  I will call Dr. Quintella Reichert assessment and     for possible transfer to [facility].  2. Substance abuse.  Patient will require counseling and detoxification as     an outpatient.  3. Complaint of chest pain.  I will schedule cardiac enzymes and a 12 lead     electrocardiogram.  4. Patient to be admitted under the hospitalist service.   Further work up     and management will depend on clinical course.                                               Sarita Bottom, M.D.    DW/MEDQ  D:  10/04/2002  T:  10/05/2002  Job:  [id number]

## 2010-09-13 NOTE — Op Note (Signed)
NAMEWYLENE, WEISSMAN                           ACCOUNT NO.:  [id number]   MEDICAL RECORD NO.:  [id number]                   PATIENT TYPE:  OBV   LOCATION:  A213                                 FACILITY:  [facility]   PHYSICIAN:  R. Roetta Sessions, M.D.              DATE OF BIRTH:  1975/05/04   DATE OF PROCEDURE:  02/18/2002  DATE OF DISCHARGE:                                 OPERATIVE REPORT   PROCEDURE:  Diagnostic esophagogastroduodenoscopy.   ENDOSCOPIST:  Gerrit Friends. Rourk, M.D.   INDICATIONS FOR PROCEDURE:  The patient is a 35 year old lady admitted to  the hospital with abdominal chest pain in the setting of polysubstance  abuse.  She has a marked microcytic anemia.  EGD is now being done to  further evaluate her chest and abdominal pain.  This  approach has been  discussed with the patient.  The potential risks, benefits, and alternatives  have been reviewed; and questions answered.  Please see my dictated H&P, ASA  II.   DESCRIPTION OF PROCEDURE:  O2 saturation, blood pressure, pulse and  respirations were monitored throughout the entire procedure.   CONSCIOUS SEDATION:  Versed 5 mg IV, Demerol 125 mg IV.   INSTRUMENT:  Olympus video chip adult gastroscope.   FINDINGS:  Examination of the tubular esophagus revealed multiple distal  esophageal erosions. There were four linear erosions in 4-quadrant  distribution approximately 45 cm in length.  There was a noncritical  Schatzki ring.  There was no evidence of Barrett's esophagus.  The EG  junction was easily traversed.   STOMACH:  The gastric cavity had some retained food within it.  It  insufflated well with air.  A thorough examination of the gastric mucosa  including a retroflex view of the proximal stomach and esophagogastric  junction demonstrated a small hiatal hernia and the Schatzki ring was again  noted.  I was unable to identify any erosion or ulcer crater.  I moved the  patient from the left lateral decubitus to  supine trying to move the food  out of the way.  We attempted to wash it.  We could not totally get a  complete view of the body of the stomach because of retained food.  However,  most of the stomach was seen and the gastric mucosa otherwise appeared  normal.  The pylorus was patent and easily traversed.   DUODENUM:  The bulb and the second portion appeared normal.   THERAPY/DIAGNOSTIC MANEUVERS:  None.   The patient tolerated the procedure well and was reacted in endoscopy.   IMPRESSION:  1. Prominent distal esophageal erosions consistent with moderately severe     erosive reflux esophagitis.  2. Noncritical Schatzki ring, not manipulated.  3. Small hiatal hernia.  4. Retained food precluded complete examination of the stomach. The stomach     that was seen appeared normal.  5. Normal D1 and D2.  DISCUSSION:  I suspect that erosive reflux esophagitis is the likely  contributing factor to her recent chest pain.   RECOMMENDATIONS:  1. We continue Protonix 40 mg orally indefinitely.  2. She is proven to, in fact, be iron deficit.  Would recommend a     colonoscopy as an outpatient to complete her GI evaluation.                                               Jonathon Bellows, M.D.    RMR/MEDQ  D:  02/18/2002  T:  02/18/2002  Job:  [id number]   cc:   Hanley Hays. Dechurch, M.D.  [address]  [place]  [place] [address]  Fax: [phone]
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
## 2010-09-13 NOTE — Consult Note (Signed)
[name], [name]                           ACCOUNT NO.:  [id number]   MEDICAL RECORD NO.:  [id number]                   PATIENT TYPE:  OBV   LOCATION:  A213                                 FACILITY:  [facility]   PHYSICIAN:  R. Roetta Sessions, M.D.              DATE OF BIRTH:  04-16-1976   DATE OF CONSULTATION:  02/17/2002  DATE OF DISCHARGE:                                   CONSULTATION   REASON FOR CONSULTATION:  Chest and abdominal pain.   HISTORY OF PRESENT ILLNESS:  This patient is a 35 year old lady who was  admitted to the hospital February 16, 2002, complaining of the recent onset  of intermittent right-sided chest pain and right upper quadrant abdominal  pain over the past week, which was worse the day that she came to the  emergency department.  She was brought to the hospital by EMS.  She had not  had any nausea or vomiting, no fever or chills, no change in bowel habits,  no melena or rectal bleeding.  She denies reflux symptoms, odynophagia, or  dysphagia.  She has had over a 30-pound weight loss over the past few years.  She recently was consuming large amounts of alcohol and uses crack cocaine  and marijuana as well.  Admission hemoglobin was 8.1 and MCV of 64.  She has  remained stable thus far while hospitalized.  She has been treated with IV  Protonix.  Empirically, ultrasound of the abdomen today demonstrated a  small, contracted gallbladder with thickening of the gallbladder wall,  probably not abnormal for the small size of the gallbladder.  Otherwise,  there were no significant abnormalities.  A repeat H & H was 7.3 and 24.3  today. She has not been transfused.  She is tolerating her anemia very well.  She has refused hemoccult testing. Her white count was 7,600 on admission.  AST and ALT 12 and 12.  ALT 64.  Total bilirubin 0.5, sodium 135, potassium  3.8, chloride 105, CO2 27, glucose 95, BUN 7, creatinine 0.7, calcium 8.7,  total protein 6.4, albumin 3.6.  Iron  studies were pending.  She has been  seen by the rehab team.  She has remained afebrile and hemodynamically  stable.  A chest x-ray was negative on admission.  EKG demonstrated sinus  rhythm.  The patient states she has somewhat heavy menstrual cycles every  month.   PAST MEDICAL HISTORY:  History of bronchitis.  History of COPD.   PAST SURGICAL HISTORY:  Tubal ligation.   CURRENT MEDICATIONS:  None.   ALLERGIES:  PENICILLIN.   HABITS:  Polysubstance abuse.   FAMILY HISTORY:  Noncontributory.   SOCIAL HISTORY:  The patient has three children.  She lives with her father.  She lives with her boyfriend.  She is unemployed.  Substance abuse as noted  above.   REVIEW OF SYSTEMS:  As in the history  of present illness.   PHYSICAL EXAMINATION:  GENERAL:  Reveals a pleasant 35 year old lady who is  somewhat disinterested in her current situation.  VITAL SIGNS: Temperature 96.5, pulse 90, respiratory rate 20, BP 129/69.  SKIN:  Warm.  No jaundice.  LUNGS:  Clear to auscultation.  CARDIAC:  Regular rate and rhythm without murmur, gallop, or rub.  ABDOMEN:  Nondistended with positive bowel sounds. She has some minimal  right upper tenderness to palpation.  No appreciable mass or organomegaly.  EXTREMITIES:  No edema.   IMPRESSION:  This patient is a 35 year old lady admitted to the hospital  with somewhat nonspecific chest and abdominal discomfort in a setting of  polysubstance abuse.   She does not have any apparent life threatening issues at this point in time  and no obvious gallbladder disease, gastroesophageal reflux disease, or  peptic ulcer disease.  I would be more concerned about an acute chest pain  syndrome secondary to crack cocaine use recently.  I do think it would be  worthwhile to survey her upper GI tract in the way of performing further  limited evaluation while she is in here the in hospital.  I have offered  this procedure tomorrow morning.  The potential risks,  benefits, and  alternatives have been reviewed and questions answered.  She is agreeable.   She does have a significant microcytic anemia which she seems to be  tolerating very well at this time.  We will make further recommendations  based on the findings of EGD tomorrow.  Her H & H will be repeated tomorrow  morning.   I have discussed my recommendations with Dr. Elesa Hacker at the nurse's station  earlier today.   I would like to thank Dr. Elesa Hacker for letting me see this lady today.                                               Jonathon Bellows, M.D.    RMR/MEDQ  D:  02/17/2002  T:  02/17/2002  Job:  [id number]   cc:   Elesa Hacker, MD

## 2010-11-03 ENCOUNTER — Emergency Department (HOSPITAL_COMMUNITY)
Admission: EM | Admit: 2010-11-03 | Discharge: 2010-11-03 | Disposition: A | Discharge status: Home / Self Care | Payer: Medicaid Other | Attending: Emergency Medicine | Admitting: Emergency Medicine

## 2010-11-03 ENCOUNTER — Encounter: Payer: Self-pay | Admitting: Emergency Medicine

## 2010-11-03 DIAGNOSIS — S20229A Contusion of unspecified back wall of thorax, initial encounter: Secondary | ICD-10-CM | POA: Insufficient documentation

## 2010-11-03 DIAGNOSIS — T148XXA Other injury of unspecified body region, initial encounter: Secondary | ICD-10-CM

## 2010-11-03 DIAGNOSIS — F17200 Nicotine dependence, unspecified, uncomplicated: Secondary | ICD-10-CM | POA: Insufficient documentation

## 2010-11-03 DIAGNOSIS — W19XXXA Unspecified fall, initial encounter: Secondary | ICD-10-CM | POA: Insufficient documentation

## 2010-11-03 HISTORY — DX: Unspecified convulsions: R56.9

## 2010-11-03 MED ORDER — HYDROCODONE-ACETAMINOPHEN 5-325 MG PO TABS: 2.0000 | ORAL_TABLET | ORAL | Status: AC | PRN

## 2010-11-03 MED ORDER — METHOCARBAMOL 500 MG PO TABS: 500.0000 mg | ORAL_TABLET | Freq: Four times a day (QID) | ORAL | Status: AC

## 2010-11-03 NOTE — ED Notes (Signed)
PA in with pt at this time  

## 2010-11-03 NOTE — ED Notes (Signed)
Pt slipped and fell x 2 days ago and now c/o lower back pain, rates pain 10. otc meds without relief. Denies hitting head or LOC. NAD

## 2010-11-03 NOTE — ED Provider Notes (Signed)
History     Chief Complaint  Patient presents with  . Fall   Patient is a 35 y.o. female presenting with fall. The history is provided by the patient.  Fall The accident occurred 2 days ago. The fall occurred while walking. Distance fallen: from standing position. She landed on a hard floor. There was no blood loss. Point of impact: buttocks. Pain location: lower back. The pain is at a severity of 10/10. The pain is moderate. She was ambulatory at the scene. There was no entrapment after the fall. There was no drug use involved in the accident. Pertinent negatives include no fever, no numbness, no abdominal pain, no vomiting, no hematuria, no headaches and no tingling. The symptoms are aggravated by standing and ambulation. She has tried NSAIDs for the symptoms. The treatment provided no relief.    Past Medical History  Diagnosis Date  . Seizures     History reviewed. No pertinent past surgical history.  History reviewed. No pertinent family history.  History  Substance Use Topics  . Smoking status: Current Everyday Smoker    Types: Cigarettes  . Smokeless tobacco: Not on file  . Alcohol Use: No    OB History    Grav Para Term Preterm Abortions TAB SAB Ect Mult Living                  Review of Systems  Constitutional: Negative for fever.  HENT: Negative.   Eyes: Negative.   Respiratory: Negative.   Cardiovascular: Negative.   Gastrointestinal: Negative for vomiting and abdominal pain.  Genitourinary: Negative for dysuria and hematuria.        No incontinence  Musculoskeletal: Positive for back pain.  Skin: Negative.   Neurological: Negative for tingling, weakness, numbness and headaches.    Physical Exam  BP 124/68  Pulse 77  Temp(Src) 97.9 F (36.6 C) (Oral)  Resp 16  Ht 5\' 6"  (1.676 m)  Wt 180 lb (81.647 kg)  BMI 29.05 kg/m2  SpO2 98%  LMP 11/03/2010  Physical Exam  Constitutional: She is oriented to person, place, and time. Vital signs are normal. She  appears well-developed and well-nourished.  Non-toxic appearance.  HENT:  Head: Normocephalic and atraumatic.  Eyes: Conjunctivae are normal. Pupils are equal, round, and reactive to light.  Neck: Normal range of motion. Neck supple.  Cardiovascular: Normal rate, regular rhythm and normal heart sounds.   Pulmonary/Chest: Effort normal and breath sounds normal.  Abdominal: Soft. There is no tenderness. There is no rebound and no guarding.  Musculoskeletal: She exhibits tenderness.       Lumbar back: She exhibits tenderness, swelling and pain. She exhibits normal range of motion and normal pulse.       Arms:      ttp of the left  lumbar paraspinal muscles  Lymphadenopathy:    She has no cervical adenopathy.  Neurological: She is alert and oriented to person, place, and time. Coordination normal.  Skin: Skin is warm and dry.    ED Course  Procedures  MDM Patient is ambulatory, no focal neuro deficits.  DP pulses are strong bilaterally      Tammy L. [place], [place] 11/03/10 1447  Evaluation and management were performed by NP/PA under my supervision/collaboration.  Taraneh Metheney B.   Taheem Fricke B. Bernette Mayers, MD 11/05/10 [phone]

## 2010-11-05 NOTE — ED Provider Notes (Signed)
Evaluation and management were performed by NP/PA under my supervision/collaboration.  Dacy Enrico B.   Britley Gashi B. Bernette Mayers, MD 11/05/10 [phone]

## 2011-01-14 ENCOUNTER — Other Ambulatory Visit: Payer: Self-pay | Admitting: Neurology

## 2011-01-14 DIAGNOSIS — R569 Unspecified convulsions: Secondary | ICD-10-CM

## 2011-01-22 ENCOUNTER — Ambulatory Visit (HOSPITAL_COMMUNITY)
Admission: RE | Admit: 2011-01-22 | Discharge: 2011-01-22 | Disposition: A | Discharge status: Home / Self Care | Payer: Medicaid Other | Source: Ambulatory Visit | Attending: Neurology | Admitting: Neurology

## 2011-01-22 DIAGNOSIS — R569 Unspecified convulsions: Secondary | ICD-10-CM | POA: Insufficient documentation

## 2011-01-22 MED ORDER — GADOBENATE DIMEGLUMINE 529 MG/ML IV SOLN: 17.0000 mL | Freq: Once | INTRAVENOUS | Status: AC | PRN

## 2011-02-04 LAB — BASIC METABOLIC PANEL
CO2: 28
GFR calc Af Amer: >60
GFR calc non Af Amer: >60
Glucose, Bld: 170 — ABNORMAL HIGH
Potassium: 3.7
Sodium: 139

## 2011-03-26 ENCOUNTER — Encounter (HOSPITAL_COMMUNITY): Payer: Self-pay | Admitting: *Deleted

## 2011-03-26 ENCOUNTER — Emergency Department (HOSPITAL_COMMUNITY): Payer: Medicaid Other

## 2011-03-26 ENCOUNTER — Emergency Department (HOSPITAL_COMMUNITY)
Admission: EM | Admit: 2011-03-26 | Discharge: 2011-03-26 | Disposition: A | Discharge status: Home / Self Care | Payer: Medicaid Other | Attending: Emergency Medicine | Admitting: Emergency Medicine

## 2011-03-26 DIAGNOSIS — M25539 Pain in unspecified wrist: Secondary | ICD-10-CM | POA: Insufficient documentation

## 2011-03-26 DIAGNOSIS — R569 Unspecified convulsions: Secondary | ICD-10-CM | POA: Insufficient documentation

## 2011-03-26 DIAGNOSIS — F17200 Nicotine dependence, unspecified, uncomplicated: Secondary | ICD-10-CM | POA: Insufficient documentation

## 2011-03-26 DIAGNOSIS — T7411XA Adult physical abuse, confirmed, initial encounter: Secondary | ICD-10-CM | POA: Insufficient documentation

## 2011-03-26 DIAGNOSIS — M549 Dorsalgia, unspecified: Secondary | ICD-10-CM | POA: Insufficient documentation

## 2011-03-26 MED ORDER — HYDROCODONE-ACETAMINOPHEN 5-325 MG PO TABS
2.0000 | ORAL_TABLET | Freq: Once | ORAL | Status: AC
  Administered 2011-03-26: 2 via ORAL
  Filled 2011-03-26: qty 2

## 2011-03-26 MED ORDER — IBUPROFEN 800 MG PO TABS: 800.0000 mg | ORAL_TABLET | Freq: Three times a day (TID) | ORAL | Status: AC

## 2011-03-26 MED ORDER — HYDROCODONE-ACETAMINOPHEN 5-325 MG PO TABS: 1.0000 | ORAL_TABLET | ORAL | Status: AC | PRN

## 2011-03-26 MED ORDER — ACETAMINOPHEN 500 MG PO TABS
1000.0000 mg | ORAL_TABLET | Freq: Once | ORAL | Status: AC
  Administered 2011-03-26: 1000 mg via ORAL
  Filled 2011-03-26: qty 2
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
## 2011-04-04 ENCOUNTER — Emergency Department (HOSPITAL_COMMUNITY)
Admission: EM | Admit: 2011-04-04 | Discharge: 2011-04-04 | Discharge status: Against Medical Advice | Payer: Medicaid Other | Attending: Emergency Medicine | Admitting: Emergency Medicine

## 2011-04-04 ENCOUNTER — Encounter (HOSPITAL_COMMUNITY): Payer: Self-pay | Admitting: *Deleted

## 2011-04-04 DIAGNOSIS — R109 Unspecified abdominal pain: Secondary | ICD-10-CM | POA: Insufficient documentation

## 2011-04-04 DIAGNOSIS — M549 Dorsalgia, unspecified: Secondary | ICD-10-CM | POA: Insufficient documentation

## 2011-04-04 NOTE — ED Notes (Signed)
Pt c/o lower back pain and lower abd pain x 1 week; pt denies any n/v

## 2011-06-20 ENCOUNTER — Emergency Department (HOSPITAL_COMMUNITY): Payer: Medicaid Other

## 2011-06-20 ENCOUNTER — Emergency Department (HOSPITAL_COMMUNITY)
Admission: EM | Admit: 2011-06-20 | Discharge: 2011-06-20 | Disposition: A | Discharge status: Home / Self Care | Payer: Medicaid Other | Attending: Emergency Medicine | Admitting: Emergency Medicine

## 2011-06-20 ENCOUNTER — Encounter (HOSPITAL_COMMUNITY): Payer: Self-pay | Admitting: *Deleted

## 2011-06-20 DIAGNOSIS — M549 Dorsalgia, unspecified: Secondary | ICD-10-CM | POA: Insufficient documentation

## 2011-06-20 DIAGNOSIS — R569 Unspecified convulsions: Secondary | ICD-10-CM | POA: Insufficient documentation

## 2011-06-20 DIAGNOSIS — F17200 Nicotine dependence, unspecified, uncomplicated: Secondary | ICD-10-CM | POA: Insufficient documentation

## 2011-06-20 LAB — CBC
Hemoglobin: 13.3 g/dL (ref 12.0–15.0)
MCH: 34.8 pg — ABNORMAL HIGH (ref 26.0–34.0)
MCV: 98.4 fL (ref 78.0–100.0)
Platelets: 93 10*3/uL — ABNORMAL LOW (ref 150–400)
RBC: 3.82 MIL/uL — ABNORMAL LOW (ref 3.87–5.11)
WBC: 7.3 10*3/uL (ref 4.0–10.5)

## 2011-06-20 LAB — DIFFERENTIAL
Eosinophils Absolute: 0.1 10*3/uL (ref 0.0–0.7)
Lymphocytes Relative: 37 % (ref 12–46)
Lymphs Abs: 2.7 10*3/uL (ref 0.7–4.0)
Monocytes Relative: 18 % — ABNORMAL HIGH (ref 3–12)
Neutrophils Relative %: 43 % (ref 43–77)

## 2011-06-20 LAB — BASIC METABOLIC PANEL
BUN: 13 mg/dL (ref 6–23)
CO2: 28 mEq/L (ref 19–32)
Chloride: 102 mEq/L (ref 96–112)
GFR calc non Af Amer: 62 mL/min — ABNORMAL LOW (ref >90)
Glucose, Bld: 96 mg/dL (ref 70–99)
Potassium: 3.6 mEq/L (ref 3.5–5.1)
Sodium: 139 mEq/L (ref 135–145)

## 2011-06-20 MED ORDER — SODIUM CHLORIDE 0.9 % IV BOLUS (SEPSIS)
1000.0000 mL | Freq: Once | INTRAVENOUS | Status: AC
  Administered 2011-06-20: 1000 mL via INTRAVENOUS

## 2011-06-20 MED ORDER — TRAMADOL HCL 50 MG PO TABS
50.0000 mg | ORAL_TABLET | Freq: Once | ORAL | Status: AC
  Administered 2011-06-20: 50 mg via ORAL
  Filled 2011-06-20 (×2): qty 1

## 2011-06-20 MED ORDER — TRAMADOL HCL 50 MG PO TABS: 50.0000 mg | ORAL_TABLET | Freq: Four times a day (QID) | ORAL | Status: AC | PRN

## 2011-06-20 MED ORDER — KETOROLAC TROMETHAMINE 30 MG/ML IJ SOLN: 30.0000 mg | Freq: Once | INTRAMUSCULAR | Status: DC

## 2011-06-20 MED ORDER — SODIUM CHLORIDE 0.9 % IV SOLN
Freq: Once | INTRAVENOUS | Status: AC
  Administered 2011-06-20: 1000 mL via INTRAVENOUS

## 2011-06-20 NOTE — ED Notes (Signed)
States she had a seizure 2 hours ago and hit left side of nose on coffee table and also has back pain

## 2011-06-20 NOTE — ED Notes (Signed)
Pt noticed one of her bottom teeth are loose.

## 2011-06-20 NOTE — ED Notes (Signed)
Pt reports has a brain tumor and is supposed to have surgery to remove tumor.  PT reports has history of seizures and takes depakote daily.  Reports has been taking her medication as prescribed.  Approx 2 hours ago had seizure witnessed by friend and c/o pain in lower back, headache, and sore nose.  Pt alert and oriented.

## 2011-06-20 NOTE — ED Provider Notes (Signed)
This chart was scribed for Gerhard Munch, MD by Wallis Mart. The patient was seen in room APA19/APA19 and the patient's care was started at 3:54 PM.   CSN: [id number]  Arrival date & time 06/20/11  1506   First MD Initiated Contact with Patient 06/20/11 1537      Chief Complaint  Patient presents with  . Seizures    HPI [name] is a 36 y.o. female who presents to the Emergency Department complaining of a seizure that occurred 2 and a half hours ago, witnessed by a friend. Pt does not remember the actual seizure, but remembers waking up after. States she hit left side of nose on coffee table.   Pt c/o nose pain, headache, back pain, loose tooth, confusion. Pt w/ h/o seizures and takes depakote daily.   Pt denies cough, cold, fever. There are no other associated symptoms and no other alleviating or aggravating factors.  Notably, the patient has a history of AVM.  She is scheduled for repair next week.   Pt smokes cigarettes.    PCP: Dr. Gerilyn Pilgrim Past Medical History  Diagnosis Date  . Seizures   . Brain tumor     History reviewed. No pertinent past surgical history.  No family history on file.  History  Substance Use Topics  . Smoking status: Current Everyday Smoker -- 1.0 packs/day    Types: Cigarettes  . Smokeless tobacco: Not on file  . Alcohol Use: No    OB History    Grav Para Term Preterm Abortions TAB SAB Ect Mult Living                  Review of Systems  Constitutional: Negative for fever.  HENT: Negative for rhinorrhea.   Eyes: Negative for pain.  Respiratory: Negative for cough and shortness of breath.   Cardiovascular: Negative for chest pain.  Gastrointestinal: Negative for nausea, vomiting, abdominal pain and diarrhea.  Genitourinary: Negative for dysuria.  Musculoskeletal: Positive for back pain.  Skin: Negative for rash.  Neurological: Negative for weakness and headaches.    Allergies  Penicillins  Home Medications   Current  Outpatient Rx  Name Route Sig Dispense Refill  . DIVALPROEX SODIUM 500 MG PO TBEC Oral Take 2,000 mg by mouth 2 (two) times daily.       BP 125/71  Pulse 87  Temp 98.8 F (37.1 C)  Resp 20  Ht 5\' 6"  (1.676 m)  Wt 185 lb (83.915 kg)  BMI 29.86 kg/m2  SpO2 98%  LMP 06/13/2011  Physical Exam  Nursing note and vitals reviewed. Constitutional: She is oriented to person, place, and time. She appears well-developed and well-nourished. No distress.  HENT:  Head: Normocephalic and atraumatic. Head is without raccoon's eyes, without Battle's sign, without abrasion, without contusion, without laceration, without right periorbital erythema and without left periorbital erythema.  Eyes: EOM are normal. Pupils are equal, round, and reactive to light.       Reactive pupils   Neck: Neck supple. No tracheal deviation present.  Cardiovascular: Normal rate and regular rhythm.   Pulmonary/Chest: Effort normal and breath sounds normal. No respiratory distress.  Abdominal: Soft. She exhibits no distension.  Musculoskeletal: Normal range of motion. She exhibits no edema.  Neurological: She is alert and oriented to person, place, and time. No sensory deficit.       Good grip strength Pt slightly tremulous    Skin: Skin is warm and dry.  Psychiatric: She has a normal mood and  affect. Her behavior is normal.    ED Course  Procedures (including critical care time) DIAGNOSTIC STUDIES: Oxygen Saturation is 98% on room air, normal by my interpretation.    COORDINATION OF CARE:    Labs Reviewed  CBC - Abnormal; Notable for the following:    RBC 3.82 (*)    MCH 34.8 (*)    Platelets 93 (*)    All other components within normal limits  DIFFERENTIAL - Abnormal; Notable for the following:    Monocytes Relative 18 (*)    Monocytes Absolute 1.3 (*)    All other components within normal limits  BASIC METABOLIC PANEL - Abnormal; Notable for the following:    Creatinine, Ser 1.13 (*)    GFR calc non  Af Amer 62 (*)    GFR calc Af Amer 72 (*)    All other components within normal limits  VALPROIC ACID LEVEL  URINALYSIS, ROUTINE W REFLEX MICROSCOPIC   No results found.   No diagnosis found.  CT reviewed by me  MDM  I personally performed the services described in this documentation, which was scribed in my presence. The recorded information has been reviewed and considered.   This young female with a history of AVM, scheduled for repair of this week, and seizures now presents following a witnessed seizure.  On exam the patient is in no distress, though she is mildly tremulous.  Patient notes some pain about the area where she fell, but has no notable deformities, nor any contusions or ecchymosis.  There no focal neurologic deficits, and the patient's vital signs are unremarkable.  The patient's valproic acid level is appropriate.  The patient has a history of seizures while on her medications previously.  Given the expected revision within the next week, the patient is appropriate for discharge with continued evaluation and management by her neurologist.  The patient was advised on the necessity to maintain all upcoming appointments.  Prior to discharge the patient requests narcotics.  Patient was informed that should he has one provider a narcotic for her.  She was provided non-narcotic analgesics instead.   Gerhard Munch, MD 06/20/11 [phone]

## 2011-06-20 NOTE — Discharge Instructions (Signed)
It is very important that you continue to work with your neurologist on resolution of your medical condition.  If you develop any new, or concerning changes in her condition prior to your next appointment, please return to the emergency department expeditiously for repeat evaluation.Driving and Equipment Restrictions Some medical problems make it dangerous to drive, ride a bike, or use machines. Some of these problems are:  A hard blow to the head (concussion).   Passing out (fainting).   Twitching and shaking (seizures).   Low blood sugar.   Taking medicine to help you relax (sedatives).   Taking pain medicines.   Wearing an eye patch.   Wearing splints. This can make it hard to use parts of your body that you need to drive safely.  HOME CARE   Do not drive until your doctor says it is okay.   Do not use machines until your doctor says it is okay.  You may need a form signed by your doctor (medical release) before you can drive again. You may also need this form before you do other tasks where you need to be fully alert. MAKE SURE YOU:  Understand these instructions.   Will watch your condition.   Will get help right away if you are not doing well or get worse.  Document Released: 05/22/2004 Document Revised: 12/25/2010 Document Reviewed: 08/22/2009 [facility] [place] Patient Information 2012 [place], [place].

## 2012-08-05 ENCOUNTER — Encounter (HOSPITAL_COMMUNITY): Payer: Self-pay | Admitting: *Deleted

## 2012-08-05 ENCOUNTER — Emergency Department (HOSPITAL_COMMUNITY)
Admission: EM | Admit: 2012-08-05 | Discharge: 2012-08-05 | Disposition: A | Discharge status: Home / Self Care | Payer: Medicaid Other | Attending: Emergency Medicine | Admitting: Emergency Medicine

## 2012-08-05 DIAGNOSIS — Z86011 Personal history of benign neoplasm of the brain: Secondary | ICD-10-CM | POA: Insufficient documentation

## 2012-08-05 DIAGNOSIS — R569 Unspecified convulsions: Secondary | ICD-10-CM

## 2012-08-05 DIAGNOSIS — F17200 Nicotine dependence, unspecified, uncomplicated: Secondary | ICD-10-CM | POA: Insufficient documentation

## 2012-08-05 DIAGNOSIS — G40909 Epilepsy, unspecified, not intractable, without status epilepticus: Secondary | ICD-10-CM | POA: Insufficient documentation

## 2012-08-05 MED ORDER — DIVALPROEX SODIUM ER 500 MG PO TB24: ORAL_TABLET | ORAL | Status: DC

## 2012-08-05 NOTE — ED Notes (Signed)
Pt states needs to leave to pick her children up from school... And she needs to go NOW. EDP aware

## 2012-08-05 NOTE — ED Notes (Signed)
Pt request nurse to come to room. States she want to leave without being seen but wants a note stating she was here today. States she was suppose to be in court today. EDP aware

## 2012-08-05 NOTE — ED Notes (Signed)
Pt received from EMS secondary to an un witnessed seizure per EMS.    Pt states cannot remember anything at this point, remembers being at the [facility] office and then nothing.  Pt is on phone at this time, calling to reschedule an appointment. Pt is not cooperative with information and is very demanding of things...IE, drink, ice, blanket. Seizure pads intact on bed.

## 2012-08-05 NOTE — ED Provider Notes (Signed)
History    This chart was scribed for Marie Lennert, MD by Marlyne Beards, ED Scribe. The patient was seen in room APA05/APA05. Patient's care was started at 3:09 PM.    CSN: [id number]  Arrival date & time 08/05/12  1406   First MD Initiated Contact with Patient 08/05/12 1509      Chief Complaint  Patient presents with  . Seizures    (Consider location/radiation/quality/duration/timing/severity/associated sxs/prior treatment) Patient is a 37 y.o. female presenting with seizures. The history is provided by the patient. No language interpreter was used.  Seizures Seizure activity on arrival: no   Severity:  Moderate Progression:  Worsening Recent head injury:  During the event History of seizures: yes    [name] is a 37 y.o. female with h/o Seizures and a brain tumor  Brought in by EMS who presents to the Emergency Department complaining of a seizure onset earlier today. EMS received a call that pt was seizing. Seizure was un witnessed. Pt cannot recall anything of what happened at the time of the incident. She states when she has an episode she tends to spit up at the mouth. Pt reports she had one episode of seizing yesterday. Pt is currently at baseline in ED today. Pt states she usually takes 1500 mg of Depikote ( 3 500 mg at night) but has currently ran out of. [facility]  Past Medical History  Diagnosis Date  . Seizures   . Brain tumor     History reviewed. No pertinent past surgical history.  No family history on file.  History  Substance Use Topics  . Smoking status: Current Every Day Smoker -- 1.00 packs/day    Types: Cigarettes  . Smokeless tobacco: Not on file  . Alcohol Use: No    OB History   Grav Para Term Preterm Abortions TAB SAB Ect Mult Living                  Review of Systems  Constitutional: Negative for fatigue.  HENT: Negative for congestion, sinus pressure and ear discharge.   Eyes: Negative for  discharge.  Respiratory: Negative for cough.   Cardiovascular: Negative for chest pain.  Gastrointestinal: Negative for abdominal pain and diarrhea.  Genitourinary: Negative for frequency and hematuria.  Musculoskeletal: Negative for back pain.  Skin: Negative for rash.  Neurological: Positive for seizures. Negative for headaches.  Psychiatric/Behavioral: Negative for hallucinations.    Allergies  Penicillins  Home Medications   Current Outpatient Rx  Name  Route  Sig  Dispense  Refill  . divalproex (DEPAKOTE) 500 MG DR tablet   Oral   Take 1,500 mg by mouth daily.            BP 131/71  Pulse 106  Temp(Src) 98.2 F (36.8 C) (Oral)  Resp 20  SpO2 95%  LMP 08/05/2012  Physical Exam  Nursing note and vitals reviewed. Constitutional: She is oriented to person, place, and time. She appears well-developed.  HENT:  Head: Normocephalic.  Small abrasion to the lower right side of her lip.  Eyes: Conjunctivae and EOM are normal. No scleral icterus.  Neck: Neck supple. No thyromegaly present.  Cardiovascular: Normal rate and regular rhythm.  Exam reveals no gallop and no friction rub.   No murmur heard. Pulmonary/Chest: No stridor. She has no wheezes. She has no rales. She exhibits no tenderness.  Abdominal: She exhibits no distension. There is no tenderness. There is no rebound.  Musculoskeletal: Normal range of motion. She exhibits no edema.  Lymphadenopathy:    She has no cervical adenopathy.  Neurological: She is oriented to person, place, and time. Coordination normal.  Skin: No rash noted. No erythema.  Psychiatric: She has a normal mood and affect. Her behavior is normal.    ED Course  Procedures (including critical care time) DIAGNOSTIC STUDIES: Oxygen Saturation is 95% on room air, adequate by my interpretation.    COORDINATION OF CARE: 3:24 PM Discussed ED treatment with pt and pt agrees.  3:28 PM Consulted with pt to undergo an IV of Depikote and pt denied  treatment.    Labs Reviewed - No data to display No results found.   No diagnosis found.  Pt refused any tests and tx in the er.  She just wanted her depakote refilled  MDM     The chart was scribed for me under my direct supervision.  I personally performed the history, physical, and medical decision making and all procedures in the evaluation of this patient.Marie Lennert, MD 08/05/12 [phone]

## 2012-08-05 NOTE — ED Notes (Signed)
Pt placed on monitor and bp cuff at this time. Pt also had seizure pads placed on bed at this time. [name]

## 2012-11-08 ENCOUNTER — Encounter (HOSPITAL_COMMUNITY): Payer: Self-pay | Admitting: Emergency Medicine

## 2012-11-08 ENCOUNTER — Emergency Department (HOSPITAL_COMMUNITY)
Admission: EM | Admit: 2012-11-08 | Discharge: 2012-11-08 | Disposition: A | Discharge status: Home / Self Care | Payer: Medicaid Other | Attending: Emergency Medicine | Admitting: Emergency Medicine

## 2012-11-08 DIAGNOSIS — N949 Unspecified condition associated with female genital organs and menstrual cycle: Secondary | ICD-10-CM | POA: Insufficient documentation

## 2012-11-08 DIAGNOSIS — N938 Other specified abnormal uterine and vaginal bleeding: Secondary | ICD-10-CM | POA: Insufficient documentation

## 2012-11-08 DIAGNOSIS — Z880 Allergy status to penicillin: Secondary | ICD-10-CM | POA: Insufficient documentation

## 2012-11-08 DIAGNOSIS — Z8669 Personal history of other diseases of the nervous system and sense organs: Secondary | ICD-10-CM | POA: Insufficient documentation

## 2012-11-08 DIAGNOSIS — F17200 Nicotine dependence, unspecified, uncomplicated: Secondary | ICD-10-CM | POA: Insufficient documentation

## 2012-11-08 DIAGNOSIS — G40909 Epilepsy, unspecified, not intractable, without status epilepticus: Secondary | ICD-10-CM

## 2012-11-08 DIAGNOSIS — Z3202 Encounter for pregnancy test, result negative: Secondary | ICD-10-CM | POA: Insufficient documentation

## 2012-11-08 DIAGNOSIS — Z79899 Other long term (current) drug therapy: Secondary | ICD-10-CM | POA: Insufficient documentation

## 2012-11-08 DIAGNOSIS — M549 Dorsalgia, unspecified: Secondary | ICD-10-CM | POA: Insufficient documentation

## 2012-11-08 DIAGNOSIS — N939 Abnormal uterine and vaginal bleeding, unspecified: Secondary | ICD-10-CM

## 2012-11-08 DIAGNOSIS — R569 Unspecified convulsions: Secondary | ICD-10-CM

## 2012-11-08 DIAGNOSIS — R109 Unspecified abdominal pain: Secondary | ICD-10-CM | POA: Insufficient documentation

## 2012-11-08 LAB — WET PREP, GENITAL
Trich, Wet Prep: NONE SEEN
Yeast Wet Prep HPF POC: NONE SEEN

## 2012-11-08 LAB — CBC WITH DIFFERENTIAL/PLATELET
Eosinophils Absolute: 0.3 10*3/uL (ref 0.0–0.7)
Eosinophils Relative: 3 % (ref 0–5)
HCT: 35 % — ABNORMAL LOW (ref 36.0–46.0)
Hemoglobin: 11.8 g/dL — ABNORMAL LOW (ref 12.0–15.0)
Lymphocytes Relative: 22 % (ref 12–46)
Lymphs Abs: 1.8 10*3/uL (ref 0.7–4.0)
MCH: 33.1 pg (ref 26.0–34.0)
MCV: 98 fL (ref 78.0–100.0)
Monocytes Relative: 11 % (ref 3–12)
RBC: 3.57 MIL/uL — ABNORMAL LOW (ref 3.87–5.11)
WBC: 8.2 10*3/uL (ref 4.0–10.5)

## 2012-11-08 LAB — BASIC METABOLIC PANEL
BUN: 8 mg/dL (ref 6–23)
CO2: 30 mEq/L (ref 19–32)
Calcium: 9.1 mg/dL (ref 8.4–10.5)
GFR calc non Af Amer: 43 mL/min — ABNORMAL LOW (ref >90)
Glucose, Bld: 94 mg/dL (ref 70–99)

## 2012-11-08 MED ORDER — DIVALPROEX SODIUM 250 MG PO DR TAB
1500.0000 mg | DELAYED_RELEASE_TABLET | Freq: Once | ORAL | Status: AC
  Administered 2012-11-08: 1500 mg via ORAL
  Filled 2012-11-08: qty 6

## 2012-11-08 MED ORDER — DIVALPROEX SODIUM ER 500 MG PO TB24: 1500.0000 mg | ORAL_TABLET | Freq: Every day | ORAL | Status: DC

## 2012-11-08 NOTE — ED Notes (Signed)
Per patient witnessed seizure by friend this morning at 7. Patient unsure of how long seizure lasted. Patient reports hx of seizures in which she takes Depakote. Patient out of Depakote x3 days. Patient also c/o vaginal bleeding x22 days-states that while she was incarcerated recently the jail nurse gave her a pill to stop her menstruation but it only stopped "for a couple of days and started again."

## 2012-11-08 NOTE — ED Provider Notes (Signed)
History  This chart was scribed for Shelda Jakes, MD by Bennett Scrape, ED Scribe. This patient was seen in room APA01/APA01 and the patient's care was started at 9:51 AM.  CSN: [id number]  Arrival date & time 11/08/12  4098   First MD Initiated Contact with Patient 11/08/12 [phone]     Chief Complaint  Patient presents with  . Seizures  . Vaginal Bleeding    Patient is a 37 y.o. female presenting with seizures. The history is provided by the patient. No language interpreter was used.  Seizures Seizure activity on arrival: no   Preceding symptoms: no headache and no nausea   Initial focality:  Diffuse Return to baseline: yes   Timing:  Once Number of seizures this episode:  1 Progression:  Resolved Context: medical non-compliance (ran out 3 days ago)   Recent head injury:  No recent head injuries History of seizures: yes     HPI Comments: [name] is a 37 y.o. female with a h/o generalized seizures who presents to the Emergency Department complaining of one witnessed seizure by a friend this morning at 7 AM. She denies any injuries or falls and states that she feels back to her baseline currently. She c/o associated back pain from the seizure which she admits is normal for her after seizures. She admits that she ran out of Depakote 3 days ago. She was taking 1500 mg (3 500 mg) x1 daily. Pt also c/o vaginal bleeding with associated lower abdominal cramping for the past 22 days and states that she was started on birth control pills while incarcerated. She reports that the pills stopped her menstruation for an unknown period of time but it resumed 8 days ago. She states that her periods have been regular in the past and she denies being on any anticoagulants currently. She denies CP, SOB, fevers and chills as associated symptoms.   Past Medical History  Diagnosis Date  . Seizures   . Brain tumor    History reviewed. No pertinent past surgical history. History reviewed. No  pertinent family history. History  Substance Use Topics  . Smoking status: Current Every Day Smoker -- 1.00 packs/day for 12 years    Types: Cigarettes  . Smokeless tobacco: Never Used  . Alcohol Use: No   OB History   Grav Para Term Preterm Abortions TAB SAB Ect Mult Living   3 3 3       3      Review of Systems  Constitutional: Negative for fever and chills.  HENT: Negative for congestion, sore throat and neck pain.   Eyes: Negative for visual disturbance.  Respiratory: Negative for cough and shortness of breath.   Cardiovascular: Negative for chest pain and leg swelling.  Gastrointestinal: Positive for abdominal pain (cramping from the vaginal bleeding). Negative for nausea, vomiting and diarrhea.  Genitourinary: Positive for vaginal bleeding. Negative for dysuria.  Musculoskeletal: Negative for back pain.  Skin: Negative for rash.  Neurological: Positive for seizures. Negative for headaches.  Hematological: Does not bruise/bleed easily.  Psychiatric/Behavioral: Negative for confusion.    Allergies  Penicillins  Home Medications   Current Outpatient Rx  Name  Route  Sig  Dispense  Refill  . divalproex (DEPAKOTE ER) 500 MG 24 hr tablet   Oral   Take 1,500 mg by mouth at bedtime. Take 3 tablets at bedtime daily         . divalproex (DEPAKOTE ER) 500 MG 24 hr tablet   Oral  Take 3 tablets (1,500 mg total) by mouth daily.   30 tablet   1    Triage Vitals: BP 148/65  Pulse 81  Temp(Src) 98.3 F (36.8 C) (Oral)  Resp 17  Ht 5\' 6"  (1.676 m)  Wt 180 lb (81.647 kg)  BMI 29.07 kg/m2  SpO2 97%  LMP 11/08/2012  Physical Exam  Nursing note and vitals reviewed. Constitutional: She is oriented to person, place, and time. She appears well-developed and well-nourished. No distress.  HENT:  Head: Normocephalic and atraumatic.  Mouth/Throat: Oropharynx is clear and moist.  Eyes: Conjunctivae and EOM are normal. Pupils are equal, round, and reactive to light.  Sclera  are clear  Neck: Neck supple. No tracheal deviation present.  Cardiovascular: Normal rate and regular rhythm.   No murmur heard. Pulses:      Dorsalis pedis pulses are 2+ on the right side, and 2+ on the left side.  Pulmonary/Chest: Effort normal and breath sounds normal. No respiratory distress. She has no wheezes.  Abdominal: Soft. Bowel sounds are normal. She exhibits no distension. There is no tenderness.  Genitourinary: Vagina normal and uterus normal. No vaginal discharge found.  Normal external genitalia. Small amount of vaginal blood no clots. No cervical motion tenderness. Uterus normal size. No adnexal tenderness no obvious mass.  Musculoskeletal: Normal range of motion. She exhibits no edema (no ankle swelling).  Lymphadenopathy:    She has no cervical adenopathy.  Neurological: She is alert and oriented to person, place, and time. No cranial nerve deficit.  Pt able to move both sets of fingers and toes  Skin: Skin is warm and dry. No rash noted.  Psychiatric: She has a normal mood and affect. Her behavior is normal.    ED Course  Procedures (including critical care time)  DIAGNOSTIC STUDIES: Oxygen Saturation is 97% on room air, normal by my interpretation.    COORDINATION OF CARE: 10:05 AM-Discussed treatment plan which includes CBC panel, BMP and valproic acid level with pt at bedside and pt agreed to plan.   Labs Reviewed  CBC WITH DIFFERENTIAL - Abnormal; Notable for the following:    RBC 3.57 (*)    Hemoglobin 11.8 (*)    HCT 35.0 (*)    Platelets 129 (*)    All other components within normal limits  BASIC METABOLIC PANEL - Abnormal; Notable for the following:    Creatinine, Ser 1.51 (*)    GFR calc non Af Amer 43 (*)    GFR calc Af Amer 50 (*)    All other components within normal limits  VALPROIC ACID LEVEL - Abnormal; Notable for the following:    Valproic Acid Lvl <10.0 (*)    All other components within normal limits  GC/CHLAMYDIA PROBE AMP   PREGNANCY, URINE   Results for orders placed during the hospital encounter of 11/08/12  CBC WITH DIFFERENTIAL      Result Value Range   WBC 8.2  4.0 - 10.5 K/uL   RBC 3.57 (*) 3.87 - 5.11 MIL/uL   Hemoglobin 11.8 (*) 12.0 - 15.0 g/dL   HCT 40.9 (*) 81.1 - 91.4 %   MCV 98.0  78.0 - 100.0 fL   MCH 33.1  26.0 - 34.0 pg   MCHC 33.7  30.0 - 36.0 g/dL   RDW 78.2  95.6 - 21.3 %   Platelets 129 (*) 150 - 400 K/uL   Neutrophils Relative % 64  43 - 77 %   Neutro Abs 5.2  1.7 -  7.7 K/uL   Lymphocytes Relative 22  12 - 46 %   Lymphs Abs 1.8  0.7 - 4.0 K/uL   Monocytes Relative 11  3 - 12 %   Monocytes Absolute 0.9  0.1 - 1.0 K/uL   Eosinophils Relative 3  0 - 5 %   Eosinophils Absolute 0.3  0.0 - 0.7 K/uL   Basophils Relative 0  0 - 1 %   Basophils Absolute 0.0  0.0 - 0.1 K/uL  BASIC METABOLIC PANEL      Result Value Range   Sodium 142  135 - 145 mEq/L   Potassium 3.5  3.5 - 5.1 mEq/L   Chloride 105  96 - 112 mEq/L   CO2 30  19 - 32 mEq/L   Glucose, Bld 94  70 - 99 mg/dL   BUN 8  6 - 23 mg/dL   Creatinine, Ser 1.61 (*) 0.50 - 1.10 mg/dL   Calcium 9.1  8.4 - 09.6 mg/dL   GFR calc non Af Amer 43 (*) >90 mL/min   GFR calc Af Amer 50 (*) >90 mL/min  VALPROIC ACID LEVEL      Result Value Range   Valproic Acid Lvl <10.0 (*) 50.0 - 100.0 ug/mL  PREGNANCY, URINE      Result Value Range   Preg Test, Ur NEGATIVE  NEGATIVE    Date: 11/08/2012  Rate: 76  Rhythm: normal sinus rhythm  QRS Axis: normal  Intervals: PR shortened  ST/T Wave abnormalities: normal  Conduction Disutrbances:none  Narrative Interpretation:   Old EKG Reviewed: none available     No results found.  1. Seizure   2. Seizure disorder   3. Abnormal vaginal bleeding     MDM  Patient with 2 complaints 1 acute patient had acute seizure typical for her. Has been out of her extended release Depakote for 3 days. Patient given her normal dose here which is 1500 mg of Depakote. Prescription renewed. Patient not  given an additional bolus. Patient without any further seizures.  Second complaint was vaginal bleeding on and off for the last 22 days. Patient was incarcerated had vaginal bleeding patient states that it started to improve actually go away then they start her on a small white pill for the vaginal bleeding not sure what that was may have been a birth-control pill. She got released that stopped she restarted with bleeding 7 days ago. On today's exam no excessive bleedingevidence of sniffing and anemia. We'll not treat with hormonal treatments we'll have her followup with OB/GYN [facility] referral provided. Pelvic examination cultures done with no significant findings.    I personally performed the services described in this documentation, which was scribed in my presence. The recorded information has been reviewed and is accurate.     Shelda Jakes, MD 11/08/12 [phone]

## 2012-11-08 NOTE — ED Notes (Signed)
Water given to pt 

## 2012-11-08 NOTE — ED Notes (Signed)
Patient states she does not need anything at this time. 

## 2012-11-08 NOTE — ED Notes (Signed)
Pt states she had a seizure at 0730 this morning while she was home alone. States she has been out of depakote x 3 days. States she fell over a chair when she had the seizure. Lower back pain from fall. Also states abdominal pain not related to seizure. Pt is not postictal.

## 2012-11-08 NOTE — ED Notes (Signed)
Patient assisted to the restroom and back to room.

## 2012-11-09 LAB — GC/CHLAMYDIA PROBE AMP: CT Probe RNA: NEGATIVE

## 2012-11-10 NOTE — ED Notes (Signed)
+  Gonorrhea. Chart sent to EDP office for review. 

## 2012-11-14 ENCOUNTER — Telehealth (HOSPITAL_COMMUNITY): Payer: Self-pay | Admitting: Emergency Medicine

## 2012-11-14 NOTE — ED Notes (Signed)
Chart returned from EDP office. Per Tatyana Kirichenko PA-C, Azithromycin 250 mg tab #4, take 4 tab PO once and Cefixime 400 mg tab #1, take 1 tab PO once. °

## 2012-11-29 NOTE — ED Notes (Signed)
No response from letter after 30 days.Chart closed out and sent to medical records 

## 2013-07-20 ENCOUNTER — Encounter: Payer: Self-pay | Admitting: *Deleted

## 2013-08-04 ENCOUNTER — Encounter: Payer: Self-pay | Admitting: Obstetrics & Gynecology

## 2013-08-11 ENCOUNTER — Encounter: Payer: Self-pay | Admitting: Obstetrics & Gynecology

## 2013-10-02 ENCOUNTER — Encounter (HOSPITAL_COMMUNITY): Payer: Self-pay | Admitting: Emergency Medicine

## 2013-10-02 ENCOUNTER — Emergency Department (HOSPITAL_COMMUNITY)
Admission: EM | Admit: 2013-10-02 | Discharge: 2013-10-02 | Discharge status: Against Medical Advice | Payer: Medicaid Other | Attending: Emergency Medicine | Admitting: Emergency Medicine

## 2013-10-02 ENCOUNTER — Emergency Department (HOSPITAL_COMMUNITY): Payer: Medicaid Other

## 2013-10-02 DIAGNOSIS — Z79899 Other long term (current) drug therapy: Secondary | ICD-10-CM | POA: Insufficient documentation

## 2013-10-02 DIAGNOSIS — R569 Unspecified convulsions: Secondary | ICD-10-CM

## 2013-10-02 DIAGNOSIS — Z880 Allergy status to penicillin: Secondary | ICD-10-CM | POA: Insufficient documentation

## 2013-10-02 DIAGNOSIS — Z86011 Personal history of benign neoplasm of the brain: Secondary | ICD-10-CM | POA: Insufficient documentation

## 2013-10-02 DIAGNOSIS — F17200 Nicotine dependence, unspecified, uncomplicated: Secondary | ICD-10-CM | POA: Insufficient documentation

## 2013-10-02 DIAGNOSIS — J069 Acute upper respiratory infection, unspecified: Secondary | ICD-10-CM

## 2013-10-02 DIAGNOSIS — G40909 Epilepsy, unspecified, not intractable, without status epilepticus: Secondary | ICD-10-CM

## 2013-10-02 LAB — VALPROIC ACID LEVEL: Valproic Acid Lvl: 43.2 ug/mL — ABNORMAL LOW (ref 50.0–100.0)

## 2013-10-02 NOTE — ED Provider Notes (Signed)
CSN: [id number]     Arrival date & time 10/02/13  1026 History   First MD Initiated Contact with Patient 10/02/13 1209     Chief Complaint  Patient presents with  . Nasal Congestion      HPI Pt was seen at 1245.  Per pt, c/o gradual onset and persistence of constant runny/stuffy nose, sinus congestion, and cough for the past 3 days.  Denies fevers, no rash, no CP/SOB, no N/V/D, no abd pain.     Past Medical History  Diagnosis Date  . Seizures   . Brain tumor    Past Surgical History  Procedure Laterality Date  . Abdominal hysterectomy      History  Substance Use Topics  . Smoking status: Current Every Day Smoker -- 1.00 packs/day for 12 years    Types: Cigarettes  . Smokeless tobacco: Never Used  . Alcohol Use: Yes     Comment: daily    OB History   Grav Para Term Preterm Abortions TAB SAB Ect Mult Living   3 3 3       3      Review of Systems ROS: Statement: All systems negative except as marked or noted in the HPI; Constitutional: Negative for fever and chills. ; ; Eyes: Negative for eye pain, redness and discharge. ; ; ENMT: Negative for ear pain, hoarseness, sore throat. +nasal congestion, sinus pressure and rhinorrhea. ; ; Cardiovascular: Negative for chest pain, palpitations, diaphoresis, dyspnea and peripheral edema. ; ; Respiratory: +cough. Negative for wheezing and stridor. ; ; Gastrointestinal: Negative for nausea, vomiting, diarrhea, abdominal pain, blood in stool, hematemesis, jaundice and rectal bleeding. . ; ; Genitourinary: Negative for dysuria, flank pain and hematuria. ; ; Musculoskeletal: Negative for back pain and neck pain. Negative for swelling and trauma.; ; Skin: Negative for pruritus, rash, abrasions, blisters, bruising and skin lesion.; ; Neuro: Negative for headache, lightheadedness and neck stiffness. Negative for weakness, extremity weakness, paresthesias, +seizure.   Allergies  Penicillins  Home Medications   Prior to Admission medications    Medication Sig Start Date End Date Taking? Authorizing Provider  divalproex (DEPAKOTE ER) 500 MG 24 hr tablet Take 2,000 mg by mouth daily. 11/08/12  Yes Fredia Sorrow, MD   BP 107/64  Pulse 95  Temp(Src) 98 F (36.7 C) (Oral)  Resp 16  Ht 5\' 6"  (1.676 m)  Wt 190 lb (86.183 kg)  BMI 30.68 kg/m2  SpO2 92%  LMP 09/10/2013 Physical Exam 1250: Physical examination:  Nursing notes reviewed; Vital signs and O2 SAT reviewed;  Constitutional: Well developed, Well nourished, Well hydrated, In no acute distress; Head:  Normocephalic, atraumatic; Eyes: EOMI, PERRL, No scleral icterus; ENMT: TM's clear bilat. +edemetous nasal turbinates bilat with clear rhinorrhea. Mouth and pharynx without lesions. No tonsillar exudates. No intra-oral edema. No submandibular or sublingual edema. No hoarse voice, no drooling, no stridor. No pain with manipulation of larynx. No trismus. Mouth and pharynx normal, Mucous membranes moist; Neck: Supple, Full range of motion, No lymphadenopathy; Cardiovascular: Regular rate and rhythm, No murmur, rub, or gallop; Respiratory: Breath sounds clear & equal bilaterally, No rales, rhonchi, wheezes.  Speaking full sentences with ease, Normal respiratory effort/excursion; Chest: Nontender, Movement normal; Abdomen: Soft, Nontender, Nondistended, Normal bowel sounds; Genitourinary: No CVA tenderness; Extremities: Pulses normal, No tenderness, No edema, No calf edema or asymmetry.; Neuro: AA&Ox3, Major CN grossly intact.  Speech clear. No gross focal motor or sensory deficits in extremities. Climbs on and off stretcher easily by herself. Gait steady.;  Skin: Color normal, Warm, Dry.   ED Course  Procedures     MDM  MDM Reviewed: previous chart, nursing note and vitals Interpretation: labs and x-ray    1330:  Shortly after pt's arrival to an ED exam room, pt was found "slumped over" and "lethargic." Pt was moved from FT area to main ED for further evaluation. Pt's friend arrived  to the ED during this transfer. Pt's friend states "oh, that's how she acts after one of her seizures." Pt A&O and acting per her baseline on my exam. Pt initially was agreeable to have CXR and depakote level checked. Pt walked out of the ED before testing was resulted.      Alfonzo Feller, DO 10/04/13 1136

## 2013-10-02 NOTE — ED Notes (Signed)
Pt angry, yelling at pt's friend, pt does not want to be evaluated for seizure, states she has seizures all the time, she only wants to be seen for cough.

## 2013-10-02 NOTE — ED Notes (Signed)
Pt with productive cough yellow in color, congestion for 3 days, pt unsure of fever but denies N/V

## 2013-10-02 NOTE — ED Notes (Signed)
Pt left AMA, states she can take tylenol at home.

## 2013-10-02 NOTE — ED Notes (Signed)
Pt found in waiting room, extremely drowsy, pt able to open eyes when called, pt asked why she was out in waiting room pt did not respond, asked pt to come back to room, pt mumbled "ok" but did move, pt falling back asleep while in chair in waiting room, Evalee Jefferson, PA notified and asked pt to be moved out of fast track, triage nurse notified and pt moved to different room via wheelchair

## 2013-10-02 NOTE — ED Notes (Signed)
Pt found in waiting room by FT nurse in waiting room - triage RN to waiting room, found pt slumped over in chair, lethargic, drooling.  Alert with rubbing of arm and loud verbal prompting.  Pt did not remember how she got from [facility] room to waiting room.  Visitor that was with pt is no longer at bedside.  Pt transferred to w/c with one person assist and transported to acute care side.  Pt denies taking medications.

## 2013-10-02 NOTE — ED Notes (Signed)
Nasal congestion and cough x 3 days.

## 2013-10-18 ENCOUNTER — Encounter (HOSPITAL_COMMUNITY): Payer: Self-pay | Admitting: Emergency Medicine

## 2013-10-18 ENCOUNTER — Emergency Department (HOSPITAL_COMMUNITY): Payer: Medicaid Other

## 2013-10-18 ENCOUNTER — Emergency Department (HOSPITAL_COMMUNITY)
Admission: EM | Admit: 2013-10-18 | Discharge: 2013-10-18 | Disposition: A | Discharge status: Home / Self Care | Payer: Medicaid Other | Attending: Emergency Medicine | Admitting: Emergency Medicine

## 2013-10-18 DIAGNOSIS — IMO0002 Reserved for concepts with insufficient information to code with codable children: Secondary | ICD-10-CM | POA: Insufficient documentation

## 2013-10-18 DIAGNOSIS — Y929 Unspecified place or not applicable: Secondary | ICD-10-CM | POA: Insufficient documentation

## 2013-10-18 DIAGNOSIS — Z880 Allergy status to penicillin: Secondary | ICD-10-CM | POA: Insufficient documentation

## 2013-10-18 DIAGNOSIS — Z79899 Other long term (current) drug therapy: Secondary | ICD-10-CM | POA: Insufficient documentation

## 2013-10-18 DIAGNOSIS — R569 Unspecified convulsions: Secondary | ICD-10-CM

## 2013-10-18 DIAGNOSIS — G40909 Epilepsy, unspecified, not intractable, without status epilepticus: Secondary | ICD-10-CM | POA: Insufficient documentation

## 2013-10-18 DIAGNOSIS — Y939 Activity, unspecified: Secondary | ICD-10-CM | POA: Insufficient documentation

## 2013-10-18 DIAGNOSIS — R296 Repeated falls: Secondary | ICD-10-CM | POA: Insufficient documentation

## 2013-10-18 DIAGNOSIS — Z86011 Personal history of benign neoplasm of the brain: Secondary | ICD-10-CM | POA: Insufficient documentation

## 2013-10-18 MED ORDER — VALPROATE SODIUM 500 MG/5ML IV SOLN
500.0000 mg | Freq: Once | INTRAVENOUS | Status: AC
  Administered 2013-10-18: 500 mg via INTRAVENOUS
  Filled 2013-10-18: qty 5

## 2013-10-18 MED ORDER — OXYCODONE-ACETAMINOPHEN 5-325 MG PO TABS: 1.0000 | ORAL_TABLET | Freq: Four times a day (QID) | ORAL | Status: DC | PRN

## 2013-10-18 MED ORDER — LORAZEPAM 2 MG/ML IJ SOLN
0.5000 mg | Freq: Once | INTRAMUSCULAR | Status: AC
  Administered 2013-10-18: 0.5 mg via INTRAVENOUS
  Filled 2013-10-18: qty 1

## 2013-10-18 MED ORDER — DIVALPROEX SODIUM ER 500 MG PO TB24: 500.0000 mg | ORAL_TABLET | Freq: Every day | ORAL | Status: DC

## 2013-10-18 MED ORDER — HYDROMORPHONE HCL PF 1 MG/ML IJ SOLN
1.0000 mg | Freq: Once | INTRAMUSCULAR | Status: AC
  Administered 2013-10-18: 1 mg via INTRAVENOUS
  Filled 2013-10-18: qty 1

## 2013-10-18 NOTE — ED Notes (Signed)
Patient with no complaints at this time. Respirations even and unlabored. Skin warm/dry. Discharge instructions reviewed with patient at this time. Patient given opportunity to voice concerns/ask questions. IV removed per policy and band-aid applied to site. Patient discharged at this time and left Emergency Department with steady gait.  

## 2013-10-18 NOTE — ED Notes (Signed)
Pt reports she "ran out of seizure medicine" "but I had to wait here the other day so I just left." Pt reports having a seizure some time today and striking her lower back on a corner table, which unfortunately broke. Seizure was witnessed by "my boyfriend." Denies incontinence or oral trauma. Reports brain tumor surgery scheduled for next.

## 2013-10-18 NOTE — Discharge Instructions (Signed)
Follow up as necessary with your md

## 2013-10-18 NOTE — ED Notes (Signed)
Awaiting ride for patient to be discharged.

## 2013-10-18 NOTE — ED Provider Notes (Signed)
CSN: [id number]     Arrival date & time 10/18/13  1405 History  This chart was scribed for Marie Diego, MD by Ludger Nutting, ED Scribe. This patient was seen in room APA08/APA08 and the patient's care was started 2:22 PM.    Chief Complaint  Patient presents with  . Fall  . Seizures      Patient is a 38 y.o. female presenting with fall and seizures.  Fall This is a new problem. The current episode started 1 to 2 hours ago. Pertinent negatives include no chest pain, no abdominal pain and no headaches. Nothing aggravates the symptoms. Nothing relieves the symptoms. She has tried nothing for the symptoms.  Seizures Seizure activity on arrival: no   Seizure type:  Unable to specify Initial focality:  Unable to specify Return to baseline: yes   Severity:  Mild Timing:  Once Progression:  Resolved Recent head injury:  No recent head injuries PTA treatment:  None History of seizures: yes     HPI Comments: [name] is a 38 y.o. female with past medical history of seizures, brain tumor who presents to the Emergency Department complaining of a seizure with a subsequent fall that occurred 2 hours ago. Patient states she does not recall the seizure and her boyfriend states she had LOC but no head injury. She reports falling and striking her lower back, which has aggravated her chronic back pain. She usually takes Percocet for her this pain but she ran out of the medication 2 weeks ago. Patient states she no longer has any Depakote so she was unable to take this medication. She reports having a 7 year history of seizures. She also is scheduled to have surgery for a brain tumor. She denies neck pain, abdominal pain.   Past Medical History  Diagnosis Date  . Seizures   . Brain tumor    Past Surgical History  Procedure Laterality Date  . Abdominal hysterectomy     History reviewed. No pertinent family history. History  Substance Use Topics  . Smoking status: Current Every Day Smoker  -- 1.00 packs/day for 12 years    Types: Cigarettes  . Smokeless tobacco: Never Used  . Alcohol Use: Yes     Comment: daily    OB History   Grav Para Term Preterm Abortions TAB SAB Ect Mult Living   3 3 3       3      Review of Systems  Constitutional: Negative for appetite change and fatigue.  HENT: Negative for congestion, ear discharge and sinus pressure.   Eyes: Negative for discharge.  Respiratory: Negative for cough.   Cardiovascular: Negative for chest pain.  Gastrointestinal: Negative for abdominal pain and diarrhea.  Genitourinary: Negative for frequency and hematuria.  Musculoskeletal: Positive for back pain.  Skin: Negative for rash.  Neurological: Positive for seizures. Negative for headaches.  Psychiatric/Behavioral: Negative for hallucinations.      Allergies  Penicillins  Home Medications   Prior to Admission medications   Medication Sig Start Date End Date Taking? Authorizing Provider  divalproex (DEPAKOTE ER) 500 MG 24 hr tablet Take 2,000 mg by mouth daily. 11/08/12   Fredia Sorrow, MD   BP 157/86  Pulse 70  Temp(Src) 97.6 F (36.4 C) (Oral)  Resp 18  Ht 5\' 6"  (1.676 m)  Wt 190 lb (86.183 kg)  BMI 30.68 kg/m2  SpO2 97%  LMP 10/18/2013 Physical Exam  Nursing note and vitals reviewed. Constitutional: She is oriented to person,  place, and time. She appears well-developed.  HENT:  Head: Normocephalic.  Eyes: Conjunctivae and EOM are normal. No scleral icterus.  Neck: Neck supple. No thyromegaly present.  Cardiovascular: Normal rate, regular rhythm and normal heart sounds.  Exam reveals no gallop and no friction rub.   No murmur heard. Pulmonary/Chest: Effort normal and breath sounds normal. No stridor. She has no wheezes. She has no rales. She exhibits no tenderness.  Abdominal: She exhibits no distension. There is no tenderness. There is no rebound.  Musculoskeletal: Normal range of motion. She exhibits tenderness. She exhibits no edema.   Moderate lumbar tenderness.   Lymphadenopathy:    She has no cervical adenopathy.  Neurological: She is oriented to person, place, and time. She exhibits normal muscle tone. Coordination normal.  Skin: No rash noted. No erythema.  Psychiatric: She has a normal mood and affect. Her behavior is normal.    ED Course  Procedures (including critical care time)  DIAGNOSTIC STUDIES: Oxygen Saturation is 97% on RA, adequate by my interpretation.    COORDINATION OF CARE: 2:24 PM Discussed treatment plan with pt at bedside and pt agreed to plan.   Labs Review Labs Reviewed - No data to display  Imaging Review No results found.   EKG Interpretation None      MDM   Final diagnoses:  None   sz abd lumbar contusion.  Pt given her depakote prescription and pain med.  The chart was scribed for me under my direct supervision.  I personally performed the history, physical, and medical decision making and all procedures in the evaluation of this patient.Marie Diego, MD 10/18/13 [phone]

## 2014-02-27 ENCOUNTER — Encounter (HOSPITAL_COMMUNITY): Payer: Self-pay | Admitting: Emergency Medicine

## 2014-09-01 ENCOUNTER — Encounter (HOSPITAL_COMMUNITY): Payer: Self-pay | Admitting: Emergency Medicine

## 2014-09-01 ENCOUNTER — Emergency Department (HOSPITAL_COMMUNITY)
Admission: EM | Admit: 2014-09-01 | Discharge: 2014-09-01 | Disposition: A | Discharge status: Home / Self Care | Payer: Medicaid Other | Attending: Emergency Medicine | Admitting: Emergency Medicine

## 2014-09-01 ENCOUNTER — Emergency Department (HOSPITAL_COMMUNITY): Payer: Medicaid Other

## 2014-09-01 DIAGNOSIS — Z720 Tobacco use: Secondary | ICD-10-CM | POA: Insufficient documentation

## 2014-09-01 DIAGNOSIS — Y9389 Activity, other specified: Secondary | ICD-10-CM | POA: Diagnosis not present

## 2014-09-01 DIAGNOSIS — S3992XA Unspecified injury of lower back, initial encounter: Secondary | ICD-10-CM | POA: Diagnosis present

## 2014-09-01 DIAGNOSIS — Z8739 Personal history of other diseases of the musculoskeletal system and connective tissue: Secondary | ICD-10-CM | POA: Diagnosis not present

## 2014-09-01 DIAGNOSIS — Z880 Allergy status to penicillin: Secondary | ICD-10-CM | POA: Insufficient documentation

## 2014-09-01 DIAGNOSIS — Y998 Other external cause status: Secondary | ICD-10-CM | POA: Diagnosis not present

## 2014-09-01 DIAGNOSIS — G40909 Epilepsy, unspecified, not intractable, without status epilepticus: Secondary | ICD-10-CM | POA: Insufficient documentation

## 2014-09-01 DIAGNOSIS — Y9289 Other specified places as the place of occurrence of the external cause: Secondary | ICD-10-CM | POA: Diagnosis not present

## 2014-09-01 DIAGNOSIS — Z86011 Personal history of benign neoplasm of the brain: Secondary | ICD-10-CM | POA: Insufficient documentation

## 2014-09-01 DIAGNOSIS — W109XXA Fall (on) (from) unspecified stairs and steps, initial encounter: Secondary | ICD-10-CM | POA: Insufficient documentation

## 2014-09-01 DIAGNOSIS — S300XXA Contusion of lower back and pelvis, initial encounter: Secondary | ICD-10-CM | POA: Diagnosis not present

## 2014-09-01 DIAGNOSIS — Z79899 Other long term (current) drug therapy: Secondary | ICD-10-CM | POA: Insufficient documentation

## 2014-09-01 MED ORDER — OXYCODONE-ACETAMINOPHEN 5-325 MG PO TABS
1.0000 | ORAL_TABLET | Freq: Once | ORAL | Status: AC
  Administered 2014-09-01: 1 via ORAL
  Filled 2014-09-01: qty 1

## 2014-09-01 MED ORDER — HYDROCODONE-ACETAMINOPHEN 5-325 MG PO TABS
1.0000 | ORAL_TABLET | ORAL | Status: DC | PRN
Start: 2014-09-01 — End: 2014-11-09

## 2014-09-01 MED ORDER — CYCLOBENZAPRINE HCL 10 MG PO TABS: 10.0000 mg | ORAL_TABLET | Freq: Two times a day (BID) | ORAL | Status: DC | PRN

## 2014-09-01 NOTE — ED Notes (Signed)
Pt made aware to return if symptoms worsen or if any life threatening symptoms occur.   

## 2014-09-01 NOTE — Discharge Instructions (Signed)
Follow up with Dr. Merlene Laughter for your pain management.

## 2014-09-01 NOTE — ED Provider Notes (Signed)
CSN: [id number]     Arrival date & time 09/01/14  2878 History   First MD Initiated Contact with Patient 09/01/14 1006     Chief Complaint  Patient presents with  . Back Pain     (Consider location/radiation/quality/duration/timing/severity/associated sxs/prior Treatment) Patient is a 39 y.o. female presenting with back pain. The history is provided by the patient.  Back Pain Location:  Lumbar spine Quality:  Aching Radiates to:  Does not radiate Pain severity:  Severe Pain is:  Same all the time Onset quality:  Sudden Duration:  2 days Timing:  Constant Progression:  Worsening Chronicity:  New Context: falling   Relieved by:  Nothing Worsened by:  Ambulation, movement, standing and bending Ineffective treatments:  None tried Associated symptoms: no bladder incontinence, no bowel incontinence, no leg pain and no numbness    [name] is a 39 y.o. female who presents to the ED with low back pain. Patient reports that she fell down concrete steps 2 days ago and landed directly on her lower back. She has had worsening pain since that time. She states she also bruised her left upper arm.   Past Medical History  Diagnosis Date  . Seizures   . Brain tumor   . Back pain    Past Surgical History  Procedure Laterality Date  . Abdominal hysterectomy    . Tubal ligation     History reviewed. No pertinent family history. History  Substance Use Topics  . Smoking status: Current Every Day Smoker -- 0.50 packs/day for 12 years    Types: Cigarettes  . Smokeless tobacco: Never Used  . Alcohol Use: Yes     Comment: 2x week   OB History    Gravida Para Term Preterm AB TAB SAB Ectopic Multiple Living   3 3 3       3      Review of Systems  Gastrointestinal: Negative for bowel incontinence.  Genitourinary: Negative for bladder incontinence.  Musculoskeletal: Positive for back pain.  Neurological: Negative for numbness.  all other systems negative    Allergies    Penicillins  Home Medications   Prior to Admission medications   Medication Sig Start Date End Date Taking? Authorizing Provider  cyclobenzaprine (FLEXERIL) 10 MG tablet Take 1 tablet (10 mg total) by mouth 2 (two) times daily as needed for muscle spasms. 09/01/14   [place], NP  divalproex (DEPAKOTE ER) 500 MG 24 hr tablet Take 2,000 mg by mouth daily. 11/08/12   Fredia Sorrow, MD  divalproex (DEPAKOTE ER) 500 MG 24 hr tablet Take 1 tablet (500 mg total) by mouth daily. 10/18/13   Milton Ferguson, MD  HYDROcodone-acetaminophen (NORCO/VICODIN) 5-325 MG per tablet Take 1 tablet by mouth every 4 (four) hours as needed. 09/01/14   [place], NP  oxyCODONE-acetaminophen (PERCOCET) 10-325 MG per tablet Take 1 tablet by mouth every 4 (four) hours as needed for pain.    Historical Provider, MD  oxyCODONE-acetaminophen (PERCOCET/ROXICET) 5-325 MG per tablet Take 1 tablet by mouth every 6 (six) hours as needed for severe pain. 10/18/13   Milton Ferguson, MD   BP 144/88 mmHg  Pulse 85  Temp(Src) 97.8 F (36.6 C) (Oral)  Resp 18  Ht 5\' 6"  (1.676 m)  Wt 200 lb (90.719 kg)  BMI 32.30 kg/m2  SpO2 98%  LMP 08/27/2014 Physical Exam  Constitutional: She is oriented to person, place, and time. She appears well-developed and well-nourished. No distress.  HENT:  Head: Normocephalic and  atraumatic.  Right Ear: Tympanic membrane normal.  Left Ear: Tympanic membrane normal.  Nose: Nose normal.  Mouth/Throat: Uvula is midline, oropharynx is clear and moist and mucous membranes are normal.  Eyes: EOM are normal.  Neck: Normal range of motion. Neck supple.  Cardiovascular: Normal rate and regular rhythm.   Pulmonary/Chest: Effort normal. She has no wheezes. She has no rales.  Abdominal: Soft. Bowel sounds are normal. There is no tenderness.  Musculoskeletal: Normal range of motion.       Lumbar back: She exhibits tenderness, pain and spasm. She exhibits normal pulse.       Back:  Small area of  ecchymosis to the left upper arm. Radial pulses 2+ bilateral, equal grips.  Neurological: She is alert and oriented to person, place, and time. She has normal strength. No cranial nerve deficit or sensory deficit. Gait normal.  Reflex Scores:      Bicep reflexes are 2+ on the right side and 2+ on the left side.      Brachioradialis reflexes are 2+ on the right side and 2+ on the left side.      Patellar reflexes are 2+ on the right side and 2+ on the left side.      Achilles reflexes are 2+ on the right side and 2+ on the left side. Ambulatory with steady gait, no foot drag. Pedal pulses 2+ bilateral.   Skin: Skin is warm and dry.  Psychiatric: She has a normal mood and affect. Her behavior is normal.  Nursing note and vitals reviewed.   ED Course  Procedures (including critical care time) Patient's PCP is Dr. Merlene Laughter. Patient states that she is usually on Percocet 10 mg. For chronic pain. She is out of her medication. Discussed with the patient that we do not treat chronic pain in the ED and she will need to see Dr. Merlene Laughter for her chronic pain management. We will treat her pain associated with her recent fall.    MDM  39 y.o. female with low back pain s/p fall 2 days ago. Stable for d/c without focal neuro deficits. Will treat for pain and muscle spasm. She will follow up with Dr. Merlene Laughter for her chronic pain. Discussed with the patient and all questioned fully answered.   Final diagnoses:  Contusion of lower back, initial encounter      [facility], NP 09/01/14 [place], MD 09/02/14 [phone]

## 2014-09-01 NOTE — ED Notes (Signed)
PT c/o lower back pain worsening x4 days and denies any urinary symptoms. PT ambulatory in triage with NAD noted.

## 2014-09-01 NOTE — ED Notes (Signed)
Patient states she fell two days ago and is complaining of back pain since fall. Denies dysuria.

## 2014-09-09 ENCOUNTER — Encounter (HOSPITAL_COMMUNITY): Payer: Self-pay | Admitting: Emergency Medicine

## 2014-09-09 ENCOUNTER — Emergency Department (HOSPITAL_COMMUNITY)
Admission: EM | Admit: 2014-09-09 | Discharge: 2014-09-09 | Disposition: A | Discharge status: Home / Self Care | Payer: Medicaid Other | Attending: Emergency Medicine | Admitting: Emergency Medicine

## 2014-09-09 DIAGNOSIS — Z79899 Other long term (current) drug therapy: Secondary | ICD-10-CM | POA: Diagnosis not present

## 2014-09-09 DIAGNOSIS — Z880 Allergy status to penicillin: Secondary | ICD-10-CM | POA: Insufficient documentation

## 2014-09-09 DIAGNOSIS — Z720 Tobacco use: Secondary | ICD-10-CM | POA: Diagnosis not present

## 2014-09-09 DIAGNOSIS — Z86011 Personal history of benign neoplasm of the brain: Secondary | ICD-10-CM | POA: Diagnosis not present

## 2014-09-09 DIAGNOSIS — M545 Low back pain, unspecified: Secondary | ICD-10-CM

## 2014-09-09 DIAGNOSIS — G40909 Epilepsy, unspecified, not intractable, without status epilepticus: Secondary | ICD-10-CM | POA: Insufficient documentation

## 2014-09-09 MED ORDER — CYCLOBENZAPRINE HCL 5 MG PO TABS: 5.0000 mg | ORAL_TABLET | Freq: Three times a day (TID) | ORAL | Status: DC | PRN

## 2014-09-09 MED ORDER — ONDANSETRON 8 MG PO TBDP
8.0000 mg | ORAL_TABLET | Freq: Once | ORAL | Status: AC
  Administered 2014-09-09: 8 mg via ORAL
  Filled 2014-09-09: qty 1

## 2014-09-09 MED ORDER — HYDROMORPHONE HCL 1 MG/ML IJ SOLN
1.0000 mg | Freq: Once | INTRAMUSCULAR | Status: AC
  Administered 2014-09-09: 1 mg via INTRAMUSCULAR
  Filled 2014-09-09: qty 1

## 2014-09-09 MED ORDER — PREDNISONE 10 MG PO TABS: ORAL_TABLET | ORAL | Status: DC

## 2014-09-09 MED ORDER — OXYCODONE-ACETAMINOPHEN 5-325 MG PO TABS: 1.0000 | ORAL_TABLET | ORAL | Status: DC | PRN

## 2014-09-09 NOTE — Discharge Instructions (Signed)
Back Pain, Adult °Low back pain is very common. About 1 in 5 people have back pain. The cause of low back pain is rarely dangerous. The pain often gets better over time. About half of people with a sudden onset of back pain feel better in just 2 weeks. About 8 in 10 people feel better by 6 weeks.  °CAUSES °Some common causes of back pain include: °· Strain of the muscles or ligaments supporting the spine. °· Wear and tear (degeneration) of the spinal discs. °· Arthritis. °· Direct injury to the back. °DIAGNOSIS °Most of the time, the direct cause of low back pain is not known. However, back pain can be treated effectively even when the exact cause of the pain is unknown. Answering your caregiver's questions about your overall health and symptoms is one of the most accurate ways to make sure the cause of your pain is not dangerous. If your caregiver needs more information, he or she may order lab work or imaging tests (X-rays or MRIs). However, even if imaging tests show changes in your back, this usually does not require surgery. °HOME CARE INSTRUCTIONS °For many people, back pain returns. Since low back pain is rarely dangerous, it is often a condition that people can learn to manage on their own.  °· Remain active. It is stressful on the back to sit or stand in one place. Do not sit, drive, or stand in one place for more than 30 minutes at a time. Take short walks on level surfaces as soon as pain allows. Try to increase the length of time you walk each day. °· Do not stay in bed. Resting more than 1 or 2 days can delay your recovery. °· Do not avoid exercise or work. Your body is made to move. It is not dangerous to be active, even though your back may hurt. Your back will likely heal faster if you return to being active before your pain is gone. °· Pay attention to your body when you  bend and lift. Many people have less discomfort when lifting if they bend their knees, keep the load close to their bodies, and  avoid twisting. Often, the most comfortable positions are those that put less stress on your recovering back. °· Find a comfortable position to sleep. Use a firm mattress and lie on your side with your knees slightly bent. If you lie on your back, put a pillow under your knees. °· Only take over-the-counter or prescription medicines as directed by your caregiver. Over-the-counter medicines to reduce pain and inflammation are often the most helpful. Your caregiver may prescribe muscle relaxant drugs. These medicines help dull your pain so you can more quickly return to your normal activities and healthy exercise. °· Put ice on the injured area. °¨ Put ice in a plastic bag. °¨ Place a towel between your skin and the bag. °¨ Leave the ice on for 15-20 minutes, 03-04 times a day for the first 2 to 3 days. After that, ice and heat may be alternated to reduce pain and spasms. °· Ask your caregiver about trying back exercises and gentle massage. This may be of some benefit. °· Avoid feeling anxious or stressed. Stress increases muscle tension and can worsen back pain. It is important to recognize when you are anxious or stressed and learn ways to manage it. Exercise is a great option. °SEEK MEDICAL CARE IF: °· You have pain that is not relieved with rest or medicine. °· You have pain that does not improve in 1 week. °· You have new symptoms. °· You are generally not feeling well. °SEEK   IMMEDIATE MEDICAL CARE IF:   You have pain that radiates from your back into your legs.  You develop new bowel or bladder control problems.  You have unusual weakness or numbness in your arms or legs.  You develop nausea or vomiting.  You develop abdominal pain.  You feel faint. Document Released: 04/14/2005 Document Revised: 10/14/2011 Document Reviewed: 08/16/2013 [facility] Patient Information 2015 [place], [place]. This information is not intended to replace advice given to you by your health care provider. Make sure you  discuss any questions you have with your health care provider.   Use the the medicines as directed.  Do not drive within 4 hours of taking oxycodone as this will make you drowsy.  Avoid lifting,  Bending,  Twisting or any other activity that worsens your pain over the next week.  Apply a heating pad to your lower back 20 minutes 3-4 times daily.  You should get rechecked if your symptoms are not better over the next 5 days,  Or you develop increased pain,  Weakness in your leg(s) or loss of bladder or bowel function - these are symptoms of a worse injury.

## 2014-09-09 NOTE — ED Notes (Signed)
Patient c/o lower back pain after falling on 5/6. Per patient was seen here in ED for pain and was given pain medication (hydrocodone) and muscle relaxer (Flexiril). Per patient no relief with medications and husband reports having to assist patient out of bed in mornings. Denies any urinary symptoms.

## 2014-09-09 NOTE — ED Provider Notes (Signed)
CSN: [id number]     Arrival date & time 09/09/14  1639 History   First MD Initiated Contact with Patient 09/09/14 1650     Chief Complaint  Patient presents with  . Back Pain     (Consider location/radiation/quality/duration/timing/severity/associated sxs/prior Treatment) Patient is a 39 y.o. female presenting with back pain. The history is provided by the patient and the spouse.  Back Pain Location:  Lumbar spine Quality:  Shooting and stabbing Radiates to:  Does not radiate Pain severity:  Severe Pain is:  Same all the time Onset quality:  Sudden (after falling on 09/01/14. she was seen here that day for her injuries, xrays were negative for acute bony injury.) Duration:  8 days Timing:  Constant Progression:  Unchanged Chronicity:  Recurrent Context: falling   Relieved by:  Lying down (muscle relaxers seemed to help somewhat) Worsened by:  Bending, twisting and ambulation Ineffective treatments:  Narcotics Associated symptoms: no abdominal pain, no bladder incontinence, no bowel incontinence, no chest pain, no dysuria, no fever, no leg pain, no numbness, no paresthesias, no tingling and no weakness   Risk factors comment:  She was a patient of Dr. Merlene Laughter prior to being incarcerated.  she is trying to re-establish with him for pain management.   Past Medical History  Diagnosis Date  . Seizures   . Brain tumor   . Back pain    Past Surgical History  Procedure Laterality Date  . Abdominal hysterectomy    . Tubal ligation     History reviewed. No pertinent family history. History  Substance Use Topics  . Smoking status: Current Every Day Smoker -- 0.50 packs/day for 12 years    Types: Cigarettes  . Smokeless tobacco: Never Used  . Alcohol Use: Yes     Comment: 2x week   OB History    Gravida Para Term Preterm AB TAB SAB Ectopic Multiple Living   3 3 3       3      Review of Systems  Constitutional: Negative for fever.  Respiratory: Negative for shortness of  breath.   Cardiovascular: Negative for chest pain and leg swelling.  Gastrointestinal: Negative for abdominal pain, constipation, abdominal distention and bowel incontinence.  Genitourinary: Negative for bladder incontinence, dysuria, urgency, frequency, flank pain and difficulty urinating.  Musculoskeletal: Positive for back pain. Negative for joint swelling and gait problem.  Skin: Negative for rash.  Neurological: Negative for tingling, weakness, numbness and paresthesias.      Allergies  Penicillins  Home Medications   Prior to Admission medications   Medication Sig Start Date End Date Taking? Authorizing Provider  cyclobenzaprine (FLEXERIL) 5 MG tablet Take 1 tablet (5 mg total) by mouth 3 (three) times daily as needed for muscle spasms. 09/09/14   Evalee Jefferson, PA-C  divalproex (DEPAKOTE ER) 500 MG 24 hr tablet Take 2,000 mg by mouth daily. 11/08/12   Fredia Sorrow, MD  divalproex (DEPAKOTE ER) 500 MG 24 hr tablet Take 1 tablet (500 mg total) by mouth daily. 10/18/13   Milton Ferguson, MD  HYDROcodone-acetaminophen (NORCO/VICODIN) 5-325 MG per tablet Take 1 tablet by mouth every 4 (four) hours as needed. 09/01/14   [place], NP  oxyCODONE-acetaminophen (PERCOCET/ROXICET) 5-325 MG per tablet Take 1 tablet by mouth every 4 (four) hours as needed. 09/09/14   Evalee Jefferson, PA-C  predniSONE (DELTASONE) 10 MG tablet 6, 5, 4, 3, 2 then 1 tablet by mouth daily for 6 days total. 09/09/14   Evalee Jefferson, PA-C  BP 137/91 mmHg  Pulse 102  Temp(Src) 97.5 F (36.4 C) (Oral)  Resp 18  Ht 5\' 6"  (1.676 m)  Wt 200 lb (90.719 kg)  BMI 32.30 kg/m2  SpO2 98%  LMP 08/27/2014 Physical Exam  Constitutional: She appears well-developed and well-nourished.  HENT:  Head: Normocephalic.  Eyes: Conjunctivae are normal.  Neck: Normal range of motion. Neck supple.  Cardiovascular: Normal rate and intact distal pulses.   Pedal pulses normal.  Pulmonary/Chest: Effort normal.  Abdominal: Soft. Bowel sounds  are normal. She exhibits no distension and no mass.  Musculoskeletal: Normal range of motion. She exhibits no edema.       Lumbar back: She exhibits tenderness. She exhibits no swelling, no edema and no spasm.       Back:  Negative SLR.    Neurological: She is alert. She has normal strength. She displays no atrophy and no tremor. No sensory deficit. Gait normal.  Reflex Scores:      Patellar reflexes are 2+ on the right side and 2+ on the left side. No strength deficit noted in hip and knee flexor and extensor muscle groups.  Ankle flexion and extension intact.  Skin: Skin is warm and dry.  Psychiatric: She has a normal mood and affect.  Nursing note and vitals reviewed.   ED Course  Procedures (including critical care time) Labs Review Labs Reviewed - No data to display  Imaging Review No results found.   EKG Interpretation None      MDM   Final diagnoses:  Right-sided low back pain without sciatica    Pt given refill for flexeril,  Oxycodone and prednisone taper.  Advised heat tx.  Advised f/u with Dr. Merlene Laughter and pcp (also working on re-establishing pcp care).    No neuro deficit on exam or by history to suggest emergent or surgical presentation.  Also discussed worsened sx that should prompt immediate re-evaluation including distal weakness, bowel/bladder retention/incontinence.          Evalee Jefferson, PA-C 09/09/14 [place], MD 09/09/14 [phone]

## 2014-11-09 ENCOUNTER — Emergency Department (HOSPITAL_COMMUNITY)
Admission: EM | Admit: 2014-11-09 | Discharge: 2014-11-09 | Disposition: A | Discharge status: Home / Self Care | Payer: Medicaid Other | Attending: Emergency Medicine | Admitting: Emergency Medicine

## 2014-11-09 ENCOUNTER — Encounter (HOSPITAL_COMMUNITY): Payer: Self-pay | Admitting: *Deleted

## 2014-11-09 DIAGNOSIS — G8929 Other chronic pain: Secondary | ICD-10-CM | POA: Insufficient documentation

## 2014-11-09 DIAGNOSIS — Z86011 Personal history of benign neoplasm of the brain: Secondary | ICD-10-CM | POA: Diagnosis not present

## 2014-11-09 DIAGNOSIS — Z720 Tobacco use: Secondary | ICD-10-CM | POA: Insufficient documentation

## 2014-11-09 DIAGNOSIS — G40909 Epilepsy, unspecified, not intractable, without status epilepticus: Secondary | ICD-10-CM | POA: Diagnosis not present

## 2014-11-09 DIAGNOSIS — R569 Unspecified convulsions: Secondary | ICD-10-CM

## 2014-11-09 DIAGNOSIS — Z880 Allergy status to penicillin: Secondary | ICD-10-CM | POA: Diagnosis not present

## 2014-11-09 LAB — CBC WITH DIFFERENTIAL/PLATELET
Basophils Absolute: 0 10*3/uL (ref 0.0–0.1)
Basophils Relative: 0 % (ref 0–1)
EOS PCT: 5 % (ref 0–5)
Eosinophils Absolute: 0.3 10*3/uL (ref 0.0–0.7)
HCT: 34.6 % — ABNORMAL LOW (ref 36.0–46.0)
HEMOGLOBIN: 11.8 g/dL — AB (ref 12.0–15.0)
LYMPHS ABS: 2.5 10*3/uL (ref 0.7–4.0)
LYMPHS PCT: 36 % (ref 12–46)
MCH: 31.6 pg (ref 26.0–34.0)
MCHC: 34.1 g/dL (ref 30.0–36.0)
MCV: 92.8 fL (ref 78.0–100.0)
MONOS PCT: 11 % (ref 3–12)
Monocytes Absolute: 0.8 10*3/uL (ref 0.1–1.0)
NEUTROS PCT: 48 % (ref 43–77)
Neutro Abs: 3.4 10*3/uL (ref 1.7–7.7)
PLATELETS: 183 10*3/uL (ref 150–400)
RBC: 3.73 MIL/uL — AB (ref 3.87–5.11)
RDW: 13 % (ref 11.5–15.5)
WBC: 7.1 10*3/uL (ref 4.0–10.5)

## 2014-11-09 LAB — COMPREHENSIVE METABOLIC PANEL
ALT: 34 U/L (ref 14–54)
AST: 22 U/L (ref 15–41)
Albumin: 3.5 g/dL (ref 3.5–5.0)
Alkaline Phosphatase: 69 U/L (ref 38–126)
Anion gap: 9 (ref 5–15)
BUN: 12 mg/dL (ref 6–20)
CO2: 25 mmol/L (ref 22–32)
Calcium: 8.5 mg/dL — ABNORMAL LOW (ref 8.9–10.3)
Chloride: 103 mmol/L (ref 101–111)
Creatinine, Ser: 1.15 mg/dL — ABNORMAL HIGH (ref 0.44–1.00)
GFR calc non Af Amer: 60 mL/min — ABNORMAL LOW (ref >60)
Glucose, Bld: 110 mg/dL — ABNORMAL HIGH (ref 65–99)
POTASSIUM: 3 mmol/L — AB (ref 3.5–5.1)
SODIUM: 137 mmol/L (ref 135–145)
Total Bilirubin: 0.6 mg/dL (ref 0.3–1.2)
Total Protein: 6.4 g/dL — ABNORMAL LOW (ref 6.5–8.1)

## 2014-11-09 LAB — VALPROIC ACID LEVEL: Valproic Acid Lvl: <10 ug/mL — ABNORMAL LOW (ref 50.0–100.0)

## 2014-11-09 MED ORDER — DIVALPROEX SODIUM 250 MG PO DR TAB
500.0000 mg | DELAYED_RELEASE_TABLET | Freq: Once | ORAL | Status: AC
  Administered 2014-11-09: 500 mg via ORAL
  Filled 2014-11-09: qty 2

## 2014-11-09 MED ORDER — DIVALPROEX SODIUM 500 MG PO DR TAB: 500.0000 mg | DELAYED_RELEASE_TABLET | Freq: Once | ORAL | Status: DC

## 2014-11-09 NOTE — ED Notes (Signed)
2 week history of daily seizures d/t being out of medication for 15 days.  Normally takes Depkote.

## 2014-11-09 NOTE — Discharge Instructions (Signed)
As discussed, your evaluation today has been largely reassuring.  But, it is important that you monitor your condition carefully, and do not hesitate to return to the ED if you develop new, or concerning changes in your condition. ? ?Otherwise, please follow-up with your physician for appropriate ongoing care. ? ?

## 2014-11-09 NOTE — ED Provider Notes (Signed)
CSN: [id number]     Arrival date & time 11/09/14  1229 History   First MD Initiated Contact with Patient 11/09/14 1251     Chief Complaint  Patient presents with  . Seizures     (Consider location/radiation/quality/duration/timing/severity/associated sxs/prior Treatment) HPI Patient presents with concern of ongoing seizures. Patient was recently recently released from prison, has no access to pursue medication. It is unclear what is changed today, but the patient states that she has seizures, almost daily, has done so for the past 2 weeks. Today she had a similar seizure, with no trauma, and currently complains of no focal pain. She does acknowledge chronic pain diffusely, but no acute change. She denies confusion, disorientation, dyspnea or other focal complaints.  Past Medical History  Diagnosis Date  . Seizures   . Brain tumor   . Back pain    Past Surgical History  Procedure Laterality Date  . Abdominal hysterectomy    . Tubal ligation     History reviewed. No pertinent family history. History  Substance Use Topics  . Smoking status: Current Every Day Smoker -- 0.50 packs/day for 12 years    Types: Cigarettes  . Smokeless tobacco: Never Used  . Alcohol Use: Yes     Comment: 2x week   OB History    Gravida Para Term Preterm AB TAB SAB Ectopic Multiple Living   3 3 3       3      Review of Systems  Constitutional:       Per HPI, otherwise negative  HENT:       Per HPI, otherwise negative  Respiratory:       Per HPI, otherwise negative  Cardiovascular:       Per HPI, otherwise negative  Gastrointestinal: Negative for vomiting.  Endocrine:       Negative aside from HPI  Genitourinary:       Neg aside from HPI   Musculoskeletal:       Per HPI, otherwise negative  Skin: Negative.   Neurological: Positive for seizures. Negative for syncope.      Allergies  Penicillins  Home Medications   Prior to Admission medications   Medication Sig Start Date End  Date Taking? Authorizing Provider  divalproex (DEPAKOTE) 500 MG DR tablet Take 1 tablet (500 mg total) by mouth once. 11/09/14   Carmin Muskrat, MD   BP 146/88 mmHg  Pulse 90  Temp(Src) 98 F (36.7 C) (Oral)  Resp 20  Ht 5\' 6"  (1.676 m)  Wt 200 lb (90.719 kg)  BMI 32.30 kg/m2  SpO2 96%  LMP 08/27/2014 Physical Exam  Constitutional: She is oriented to person, place, and time. She appears well-developed and well-nourished. No distress.  Uncomfortable appearing female lying in the right lateral decubitus position, but answering all questions properly, in no distress.  HENT:  Head: Normocephalic and atraumatic.  Eyes: Conjunctivae and EOM are normal.  Cardiovascular: Normal rate and regular rhythm.   Pulmonary/Chest: Effort normal. No stridor. No respiratory distress.  Abdominal: She exhibits no distension.  Musculoskeletal:  No obvious deformity  Neurological: She is alert and oriented to person, place, and time. No cranial nerve deficit. She exhibits normal muscle tone. Coordination normal.  Skin: Skin is warm and dry.  Psychiatric: She has a normal mood and affect.  Nursing note and vitals reviewed.   ED Course  Procedures (including critical care time) Labs Review Labs Reviewed  COMPREHENSIVE METABOLIC PANEL - Abnormal; Notable for the following:  Potassium 3.0 (*)    Glucose, Bld 110 (*)    Creatinine, Ser 1.15 (*)    Calcium 8.5 (*)    Total Protein 6.4 (*)    GFR calc non Af Amer 60 (*)    All other components within normal limits  CBC WITH DIFFERENTIAL/PLATELET - Abnormal; Notable for the following:    RBC 3.73 (*)    Hemoglobin 11.8 (*)    HCT 34.6 (*)    All other components within normal limits  VALPROIC ACID LEVEL   Patient had no to competition in the emergency department, received initial loading dose of antiepileptics medication, was discharged in stable condition.  MDM   Final diagnoses:  Seizure   Patient with a history of seizures presents with  recurrent seizure activity, and no access to her anti-epileptic medication. Patient received loading dose here, prescription for additional and tablet medication, referral to follow-up with her neurologist for further ongoing evaluation. No evidence for intracranial injury, nor other acute cause of her seizures.   Carmin Muskrat, MD 11/09/14 1534

## 2015-01-06 ENCOUNTER — Encounter (HOSPITAL_COMMUNITY): Payer: Self-pay | Admitting: Emergency Medicine

## 2015-01-06 ENCOUNTER — Emergency Department (HOSPITAL_COMMUNITY)
Admission: EM | Admit: 2015-01-06 | Discharge: 2015-01-06 | Disposition: A | Discharge status: Home / Self Care | Payer: Medicaid Other | Attending: Emergency Medicine | Admitting: Emergency Medicine

## 2015-01-06 DIAGNOSIS — G40909 Epilepsy, unspecified, not intractable, without status epilepticus: Secondary | ICD-10-CM | POA: Insufficient documentation

## 2015-01-06 DIAGNOSIS — Z720 Tobacco use: Secondary | ICD-10-CM | POA: Diagnosis not present

## 2015-01-06 DIAGNOSIS — Z79899 Other long term (current) drug therapy: Secondary | ICD-10-CM | POA: Insufficient documentation

## 2015-01-06 DIAGNOSIS — Z880 Allergy status to penicillin: Secondary | ICD-10-CM | POA: Diagnosis not present

## 2015-01-06 DIAGNOSIS — Z86011 Personal history of benign neoplasm of the brain: Secondary | ICD-10-CM | POA: Diagnosis not present

## 2015-01-06 DIAGNOSIS — J0110 Acute frontal sinusitis, unspecified: Secondary | ICD-10-CM | POA: Diagnosis not present

## 2015-01-06 LAB — CBC WITH DIFFERENTIAL/PLATELET
Basophils Absolute: 0.1 10*3/uL (ref 0.0–0.1)
Basophils Relative: 2 % — ABNORMAL HIGH (ref 0–1)
Eosinophils Absolute: 0.2 10*3/uL (ref 0.0–0.7)
Eosinophils Relative: 4 % (ref 0–5)
HCT: 36.3 % (ref 36.0–46.0)
Hemoglobin: 12.3 g/dL (ref 12.0–15.0)
Lymphocytes Relative: 46 % (ref 12–46)
Lymphs Abs: 1.9 10*3/uL (ref 0.7–4.0)
MCH: 30 pg (ref 26.0–34.0)
MCHC: 33.9 g/dL (ref 30.0–36.0)
MCV: 88.5 fL (ref 78.0–100.0)
Monocytes Absolute: 0.7 10*3/uL (ref 0.1–1.0)
Monocytes Relative: 18 % — ABNORMAL HIGH (ref 3–12)
Neutro Abs: 1.2 10*3/uL — ABNORMAL LOW (ref 1.7–7.7)
Neutrophils Relative %: 30 % — ABNORMAL LOW (ref 43–77)
Platelets: 148 10*3/uL — ABNORMAL LOW (ref 150–400)
RBC: 4.1 MIL/uL (ref 3.87–5.11)
RDW: 13.9 % (ref 11.5–15.5)
WBC: 4.1 10*3/uL (ref 4.0–10.5)

## 2015-01-06 LAB — URINALYSIS, ROUTINE W REFLEX MICROSCOPIC
Bilirubin Urine: NEGATIVE
Glucose, UA: 100 mg/dL — AB
Ketones, ur: NEGATIVE mg/dL
Leukocytes, UA: NEGATIVE
Nitrite: NEGATIVE
Protein, ur: NEGATIVE mg/dL
Specific Gravity, Urine: >1.03 — ABNORMAL HIGH (ref 1.005–1.030)
Urobilinogen, UA: 0.2 mg/dL (ref 0.0–1.0)
pH: 5.5 (ref 5.0–8.0)

## 2015-01-06 LAB — BASIC METABOLIC PANEL
Anion gap: 5 (ref 5–15)
BUN: 9 mg/dL (ref 6–20)
CO2: 27 mmol/L (ref 22–32)
Calcium: 8.6 mg/dL — ABNORMAL LOW (ref 8.9–10.3)
Chloride: 103 mmol/L (ref 101–111)
Creatinine, Ser: 0.99 mg/dL (ref 0.44–1.00)
GFR calc Af Amer: >60 mL/min (ref >60)
GFR calc non Af Amer: >60 mL/min (ref >60)
Glucose, Bld: 106 mg/dL — ABNORMAL HIGH (ref 65–99)
Potassium: 3.5 mmol/L (ref 3.5–5.1)
Sodium: 135 mmol/L (ref 135–145)

## 2015-01-06 LAB — URINE MICROSCOPIC-ADD ON

## 2015-01-06 MED ORDER — OXYCODONE-ACETAMINOPHEN 5-325 MG PO TABS: 1.0000 | ORAL_TABLET | ORAL | Status: DC | PRN

## 2015-01-06 MED ORDER — OXYCODONE-ACETAMINOPHEN 5-325 MG PO TABS
2.0000 | ORAL_TABLET | Freq: Once | ORAL | Status: AC
  Administered 2015-01-06: 2 via ORAL
  Filled 2015-01-06: qty 2

## 2015-01-06 MED ORDER — DIVALPROEX SODIUM 500 MG PO DR TAB: 500.0000 mg | DELAYED_RELEASE_TABLET | Freq: Three times a day (TID) | ORAL | Status: DC

## 2015-01-06 MED ORDER — LORATADINE 10 MG PO TABS: 10.0000 mg | ORAL_TABLET | Freq: Every day | ORAL | Status: DC

## 2015-01-06 MED ORDER — IBUPROFEN 400 MG PO TABS
600.0000 mg | ORAL_TABLET | Freq: Once | ORAL | Status: AC
  Administered 2015-01-06: 600 mg via ORAL
  Filled 2015-01-06: qty 2

## 2015-01-06 MED ORDER — DIVALPROEX SODIUM 250 MG PO DR TAB
500.0000 mg | DELAYED_RELEASE_TABLET | Freq: Once | ORAL | Status: AC
  Administered 2015-01-06: 500 mg via ORAL
  Filled 2015-01-06: qty 2

## 2015-01-06 NOTE — Discharge Instructions (Signed)
Epilepsy °People with epilepsy have times when they shake and jerk uncontrollably (seizures). This happens when there is a sudden change in brain function. Epilepsy may have many possible causes. Anything that disturbs the normal pattern of brain cell activity can lead to seizures. °HOME CARE  °· Follow your doctor's instructions about driving and safety during normal activities. °· Get enough sleep. °· Only take medicine as told by your doctor. °· Avoid things that you know can cause you to have seizures (triggers). °· Write down when your seizures happen and what you remember about each seizure. Write down anything you think may have caused the seizure to happen. °· Tell the people you live and work with that you have seizures. Make sure they know how to help you. They should: °· Cushion your head and body. °· Turn you on your side. °· Not restrain you. °· Not place anything inside your mouth. °· Call for local emergency medical help if there is any question about what has happened. °· Keep all follow-up visits with your doctor. This is very important. °GET HELP IF: °· You get an infection or start to feel sick. You may have more seizures when you are sick. °· You are having seizures more often. °· Your seizure pattern is changing. °GET HELP RIGHT AWAY IF:  °· A seizure does not stop after a few seconds or minutes. °· A seizure causes you to have trouble breathing. °· A seizure gives you a very bad headache. °· A seizure makes you unable to speak or use a part of your body. °Document Released: 02/09/2009 Document Revised: 02/02/2013 Document Reviewed: 11/24/2012 °ExitCare® Patient Information ©2015 [employer]. This information is not intended to replace advice given to you by your health care provider. Make sure you discuss any questions you have with your health care provider. ° °Driving and Equipment Restrictions °Some medical problems make it dangerous to drive, ride a bike, or use machines. Some of these  problems are: °· A hard blow to the head (concussion). °· Passing out (fainting). °· Twitching and shaking (seizures). °· Low blood sugar. °· Taking medicine to help you relax (sedatives). °· Taking pain medicines. °· Wearing an eye patch. °· Wearing splints. This can make it hard to use parts of your body that you need to drive safely. °HOME CARE  °· Do not drive until your doctor says it is okay. °· Do not use machines until your doctor says it is okay. °You may need a form signed by your doctor (medical release) before you can drive again. You may also need this form before you do other tasks where you need to be fully alert. °MAKE SURE YOU: °· Understand these instructions. °· Will watch your condition. °· Will get help right away if you are not doing well or get worse. °Document Released: 05/22/2004 Document Revised: 07/07/2011 Document Reviewed: 08/22/2009 °ExitCare® Patient Information ©2015 [employer]. This information is not intended to replace advice given to you by your health care provider. Make sure you discuss any questions you have with your health care provider. ° °

## 2015-01-06 NOTE — ED Notes (Signed)
Patient states she is unable to give a urine sample at this time 

## 2015-01-06 NOTE — ED Provider Notes (Signed)
CSN: [id number]     Arrival date & time 01/06/15  1245 History   First MD Initiated Contact with Patient 01/06/15 1314     Chief Complaint  Patient presents with  . Seizures     (Consider location/radiation/quality/duration/timing/severity/associated sxs/prior Treatment) HPI   39 year old female with seizure. She has Hall past history of seizure disorder. She's been out of her Depakote for approximately 2 months. She is currently complaining of feeling tired and Hall mild headache. No other acute complaints. No fever or chills. No neck pain or stiffness. Additionally complaining of facial pressure/ congestion. She feels like she has Hall cold. No cough. No sob.   Past Medical History  Diagnosis Date  . Seizures   . Brain tumor   . Back pain    Past Surgical History  Procedure Laterality Date  . Abdominal hysterectomy    . Tubal ligation     No family history on file. Social History  Substance Use Topics  . Smoking status: Current Every Day Smoker -- 0.50 packs/day for 12 years    Types: Cigarettes  . Smokeless tobacco: Never Used  . Alcohol Use: No     Comment: history   OB History    Gravida Para Term Preterm AB TAB SAB Ectopic Multiple Living   3 3 3       3      Review of Systems  All systems reviewed and negative, other than as noted in HPI.   Allergies  Penicillins  Home Medications   Prior to Admission medications   Medication Sig Start Date End Date Taking? Authorizing Provider  divalproex (DEPAKOTE) 500 MG DR tablet Take 1 tablet (500 mg total) by mouth once. Patient taking differently: Take 1,500 mg by mouth daily.  11/09/14   Carmin Muskrat, MD   BP 122/78 mmHg  Pulse 88  Temp(Src) 97.6 F (36.4 C) (Oral)  Resp 24  Ht 5\' 6"  (1.676 m)  Wt 200 lb (90.719 kg)  BMI 32.30 kg/m2  SpO2 100%  LMP 01/06/2015 Physical Exam  Constitutional: She is oriented to person, place, and time. She appears well-developed and well-nourished. No distress.  HENT:  Head:  Normocephalic and atraumatic.  Eyes: Conjunctivae are normal. Right eye exhibits no discharge. Left eye exhibits no discharge.  Neck: Neck supple.  Cardiovascular: Normal rate, regular rhythm and normal heart sounds.  Exam reveals no gallop and no friction rub.   No murmur heard. Pulmonary/Chest: Effort normal and breath sounds normal. No respiratory distress.  Abdominal: Soft. She exhibits no distension. There is no tenderness.  Musculoskeletal: She exhibits no edema or tenderness.  Neurological: She is alert and oriented to person, place, and time. No cranial nerve deficit. She exhibits normal muscle tone. Coordination normal.  Speech clear. Content appropriate. Follows commands. Good finger to nose b/l.   Skin: Skin is warm and dry.  Psychiatric: She has Hall normal mood and affect. Her behavior is normal. Thought content normal.  Nursing note and vitals reviewed.   ED Course  Procedures (including critical care time) Labs Review Labs Reviewed - No data to display  Imaging Review No results found. I have personally reviewed and evaluated these images and lab results as part of my medical decision-making.   EKG Interpretation None      MDM   Final diagnoses:  Seizure disorder  Acute frontal sinusitis, recurrence not specified    39yF with seizure. Hx of same. nonfocal neuro exam. Back to baseline. Given dose of depakote in ED  and provided with prescription. It has been determined that no acute conditions requiring further emergency intervention are present at this time. The patient has been advised of the diagnosis and plan. I reviewed any labs and imaging including any potential incidental findings. We have discussed signs and symptoms that warrant return to the ED and they are listed in the discharge instructions.      Virgel Manifold, MD 01/19/15 [phone]

## 2015-01-06 NOTE — ED Notes (Signed)
Patient given ice chips per RN approval. 

## 2015-01-06 NOTE — ED Notes (Signed)
Pt c/o facial pain/fever/cough/congestion. Pt also states she has been out of her seizure medication x 2 months and had a seizure this morning.

## 2015-04-02 ENCOUNTER — Emergency Department (HOSPITAL_COMMUNITY)
Admission: EM | Admit: 2015-04-02 | Discharge: 2015-04-02 | Disposition: A | Discharge status: Home / Self Care | Payer: Medicaid Other | Attending: Emergency Medicine | Admitting: Emergency Medicine

## 2015-04-02 ENCOUNTER — Encounter (HOSPITAL_COMMUNITY): Payer: Self-pay | Admitting: Emergency Medicine

## 2015-04-02 DIAGNOSIS — Z760 Encounter for issue of repeat prescription: Secondary | ICD-10-CM

## 2015-04-02 DIAGNOSIS — F17210 Nicotine dependence, cigarettes, uncomplicated: Secondary | ICD-10-CM | POA: Insufficient documentation

## 2015-04-02 DIAGNOSIS — Z86018 Personal history of other benign neoplasm: Secondary | ICD-10-CM | POA: Diagnosis not present

## 2015-04-02 DIAGNOSIS — Z79899 Other long term (current) drug therapy: Secondary | ICD-10-CM | POA: Insufficient documentation

## 2015-04-02 DIAGNOSIS — M545 Low back pain: Secondary | ICD-10-CM | POA: Diagnosis not present

## 2015-04-02 DIAGNOSIS — Z880 Allergy status to penicillin: Secondary | ICD-10-CM | POA: Diagnosis not present

## 2015-04-02 DIAGNOSIS — Z9119 Patient's noncompliance with other medical treatment and regimen: Secondary | ICD-10-CM | POA: Diagnosis not present

## 2015-04-02 DIAGNOSIS — G8929 Other chronic pain: Secondary | ICD-10-CM | POA: Insufficient documentation

## 2015-04-02 DIAGNOSIS — G40909 Epilepsy, unspecified, not intractable, without status epilepticus: Secondary | ICD-10-CM | POA: Insufficient documentation

## 2015-04-02 DIAGNOSIS — R569 Unspecified convulsions: Secondary | ICD-10-CM | POA: Diagnosis present

## 2015-04-02 DIAGNOSIS — M549 Dorsalgia, unspecified: Secondary | ICD-10-CM

## 2015-04-02 HISTORY — DX: Dorsalgia, unspecified: M54.9

## 2015-04-02 HISTORY — DX: Patient's other noncompliance with medication regimen for other reason: Z91.148

## 2015-04-02 HISTORY — DX: Other chronic pain: G89.29

## 2015-04-02 HISTORY — DX: Patient's other noncompliance with medication regimen: Z91.14

## 2015-04-02 LAB — VALPROIC ACID LEVEL: Valproic Acid Lvl: <10 ug/mL — ABNORMAL LOW (ref 50.0–100.0)

## 2015-04-02 MED ORDER — METHOCARBAMOL 500 MG PO TABS: 1000.0000 mg | ORAL_TABLET | Freq: Four times a day (QID) | ORAL | Status: DC | PRN

## 2015-04-02 MED ORDER — DIVALPROEX SODIUM 500 MG PO DR TAB: 500.0000 mg | DELAYED_RELEASE_TABLET | Freq: Three times a day (TID) | ORAL | Status: DC

## 2015-04-02 MED ORDER — NAPROXEN 250 MG PO TABS: 250.0000 mg | ORAL_TABLET | Freq: Two times a day (BID) | ORAL | Status: DC | PRN

## 2015-04-02 MED ORDER — DIVALPROEX SODIUM 250 MG PO DR TAB
500.0000 mg | DELAYED_RELEASE_TABLET | Freq: Once | ORAL | Status: AC
  Administered 2015-04-02: 500 mg via ORAL
  Filled 2015-04-02: qty 2

## 2015-04-02 NOTE — ED Notes (Signed)
Out of Depakote 1500 mg for last 2 weeks.  Had 2 seizures yesterday and 3 today.   C/o back pain 10/10.

## 2015-04-02 NOTE — ED Notes (Signed)
Pt made aware to return if symptoms worsen or if any life threatening symptoms occur.   

## 2015-04-02 NOTE — Discharge Instructions (Signed)
°Emergency Department Resource Guide °1) Find a Doctor and Pay Out of Pocket °Although you won't have to find out who is covered by your insurance plan, it is a good idea to ask around and get recommendations. You will then need to call the office and see if the doctor you have chosen will accept you as a new patient and what types of options they offer for patients who are self-pay. Some doctors offer discounts or will set up payment plans for their patients who do not have insurance, but you will need to ask so you aren't surprised when you get to your appointment. ° °2) Contact Your Local Health Department °Not all health departments have doctors that can see patients for sick visits, but many do, so it is worth a call to see if yours does. If you don't know where your local health department is, you can check in your phone book. The CDC also has a tool to help you locate your state's health department, and many state websites also have listings of all of their local health departments. ° °3) Find a Walk-in Clinic °If your illness is not likely to be very severe or complicated, you may want to try a walk in clinic. These are popping up all over the country in pharmacies, drugstores, and shopping centers. They're usually staffed by nurse practitioners or physician assistants that have been trained to treat common illnesses and complaints. They're usually fairly quick and inexpensive. However, if you have serious medical issues or chronic medical problems, these are probably not your best option. ° °No Primary Care Doctor: °- Call Health Connect at  832-8000 - they can help you locate a primary care doctor that  accepts your insurance, provides certain services, etc. °- Physician Referral Service- 1-800-533-3463 ° °Chronic Pain Problems: °Organization         Address  Phone   Notes  °[facility]  (336) 297-2271 Patients need to be referred by their primary care doctor.  ° °Medication  Assistance: °Organization         Address  Phone   Notes  °[place] [facility] [address] °[place], [place] [address] (336) 641-8030 --Must be a resident of [place] °-- Must have NO insurance coverage whatsoever (no Medicaid/ Medicare, etc.) °-- The pt. MUST have a primary care doctor that directs their care regularly and follows them in the community °  °[facility]  (866) 331-1348   °[employer]  (888) 892-1162   ° °Agencies that provide inexpensive medical care: °Organization         Address  Phone   Notes  °[facility]  (336) 832-8035   °[facility]    (336) 832-7272   °[facility] [address] °[place], [place] [address] (336) 832-4777   °[facility] [address], °[place] (336) 271-4999   °[facility]    (336) 373-0678   °[facility]    (336) 272-1050   °[facility] ° [address], [place] Phone:  (336) 832-4444, Fax:  (336) 832-4440 Hours of Operation:  9 am - 6 pm, M-F.  Also accepts Medicaid/Medicare and self-pay.  °[facility] ° [address], [place] Phone: (336) 832-3150, Fax: (336) [phone]. Hours of Operation:  8:30 am - 5:30 pm, M-F.  Also accepts Medicaid and self-pay.  °[facility] [place] [address], [place] Phone: (336) 878-6027   °[facility] [address], [place], [place] (336)723-1848, Ext. 123 Mondays & Thursdays: 7-9 AM.  First 15 patients are seen on a first come, first serve basis. °  ° °Medicaid-accepting [place] Providers: ° °Organization         Address  Phone   Notes  °[facility] [address], [place] (336) 641-2100 Also accepts self-pay patients.  °[facility] [address], [place] ° (336) 856-9996   °[facility] [address], [place]  (336) 288-8857   °[facility] [address] [place] [address], [place] (336) 299-7000   °Veita Bland [address], [place]  ° (336) 373-1557 Only accepts [place] Access Medicaid patients after they have their name applied to their card.  ° °Self-Pay (no insurance) in [place]: ° °Organization         Address  Phone   Notes  °Sickle Cell Patients, [facility] [address], [place] (336) 832-1970   °[facility] [address], [place] (336) 832-4400   °[facility] [place] ° [address], [place] (336) 992-4800   °[facility]/Dr. Osei-Bonsu ° [address] [place] [address], [place] or [address], [place] (336) 841-8500 Phone number for both [place] and [place] locations is the same.  °[facility] [address], [place] (336) 299-0000   °[facility] [address] [place] [address], [place] or [address] (336) 852-7530 °(336) 878-2260   °[facility] [address], [place] (336) 350-1642, phone; (336) 294-5005, fax Sees patients 1st and 3rd Saturday of every month.  Must not qualify for public or private insurance (i.e. Medicaid, Medicare, [place] Health Choice, Veterans' Benefits) • Household income should be no more than 200% of the poverty level •The clinic cannot treat you if you are pregnant or think you are pregnant • Sexually transmitted diseases are not treated at the clinic.  ° ° °Dental Care: °Organization         Address  Phone  Notes  °[place] [facility] [address], [place] (336) 641-6152 Accepts children up to age 21 who are enrolled in Medicaid or [place] Health Choice; pregnant women with a Medicaid card; and children who have applied for Medicaid or [place] Health Choice, but were declined, whose parents can pay a reduced fee at time of service.  °[place]  [facility] [place]  [address], [place] (336) 641-7733 Accepts children up to age 21 who are enrolled in Medicaid or [place] Health Choice; pregnant women with a Medicaid card; and children who have applied for Medicaid or [place] Health Choice, but were declined, whose parents can pay a reduced fee at time of service.  °[facility] ° [address],  (336) 641-4533 Patients are seen by appointment only. Walk-ins are not accepted. [facility] will see patients 18 years of age and older. °Monday - Tuesday (8am-5pm) °Most Wednesdays (8:30-5pm) °$30 per visit, cash only  °[facility] ° [address], [place] (336) 641-4533 Patients are seen by appointment only. Walk-ins are not accepted. [facility] will see patients 18 years of age and older. °One   Wednesday Evening (Monthly: Volunteer Based).  $30 per visit, cash only  °[facility]  (919) 537-3737 for adults; Children under age 4, call Graduate Pediatric Dentistry at (919) [phone]. Children aged 4-14, please call ([phone]) [phone] to request a pediatric application. ° Dental services are provided in all areas of dental care including fillings, crowns and bridges, complete and partial dentures, implants, gum treatment, root canals, and extractions. Preventive care is also provided. Treatment is provided to both adults and children. °Patients are selected via a lottery and there is often a waiting list. °  °[facility] [address], °[place] ° (336) 763-8833 [email or web address] °  °[facility] [address], [place], [place] (336)723-1848, Ext. 123 Second and Fourth Thursday of each month, opens at 6:30 AM; Clinic ends at 9 AM.  Patients are seen on a first-come first-served basis, and a limited number are seen during each clinic.  ° °[facility] ° [address], [place], [place] (336) 723-7904    Eligibility Requirements °You must have lived in [place], or [place] counties for at least the last three months. °  You cannot be eligible for state or federal sponsored healthcare insurance, including Veterans Administration, Medicaid, or Medicare. °  You generally cannot be eligible for healthcare insurance through your employer.  °  How to apply: °Eligibility screenings are held every Tuesday and Wednesday afternoon from 1:00 pm until 4:00 pm. You do not need an appointment for the interview!  °[facility] [address], [place], [place] 336-631-2330   °[facility]  336-342-8273   °[facility]  336-703-3100   °[facility]  336-570-6415   ° °Behavioral Health Resources in the Community: °Intensive Outpatient Programs °Organization         Address  Phone  Notes  °[facility] [address], [place], [place] 336-878-6098   °[facility] [address], [place], [place] 336-832-9800   °[facility] [address], [place], [place] ° 336-882-2125   °[facility] [address],  °[place], [place] 1-800-853-5163 or 336-641-4981   °Substance Abuse Resources °Organization         Address  Phone  Notes  °[facility]  336-882-2125   °[facility]  336-784-9470   °[facility]  336-285-9073   °[facility]  336-845-3988   °Residential & Outpatient Substance Abuse Program  1-800-659-3381   °Psychological Services °Organization         Address  Phone  Notes  °[facility]  336- 832-9600   °[facility]  336- 378-7881   °[facility] [address], [place] 1-800-853-5163 or 336-641-4981   ° °Mobile Crisis Teams °Organization         Address  Phone  Notes  °[facility], Mobile Crisis Care Unit  1-877-626-1772   °Assertive °[facility] ° [address]  [place], [place] 336-834-9664   °Sharon DeEsch [address] °[place] 336-554-5454   ° °Self-Help/Support Groups °Organization         Address  Phone             Notes  °[facility]  - variety of support groups  336- 373-1402 Call for more information  °Narcotics Anonymous (NA), [facility] [address], °[place]  2 meetings at this location  ° °  Residential Treatment Programs Organization         Address  Phone  Notes  [facility] [address]    [place]  1-(619)444-9139   [facility]  [address] [place] [id number], [place], [place]   [place], [place] (678) 453-5955 Admissions: 8am-3pm M-F  [facility] [place] [address]    [place], [place] [phone]   [facility] [address] [place], [place], [place]   [facility] [address]  [place], [place]   [facility] - Intensive Outpatient [address], [place], [place]   [facility] [address]  [place], [place] 1-310-081-2371 or 564-659-3539   [facility] [address] [place], [place] Accepts Medicaid  [facility] [address],  [place] 1-(862)283-1249 Substance Abuse/Addiction Treatment   [facility] Organization         Address  Phone  Notes  [facility]  (548) 616-7991   Domenic Schwab, PhD [address] [place] [place], [place]   (256)461-4305 or 716-167-7790   [facility] [place] [address] [place], [place] [phone]   [facility] [address], [place], [place] (317) 301-5540 Insurance/Medicaid/sponsorship through [facility] [address]                                    [place], [place] [phone] [place] [address], [place] [phone]    Dr. Adele Schilder  [phone]   [facility] Dept. 1) [address] [place] 2) [place] 3)  [address], [place] [phone]  [phone]   [place] [phone] or [phone] (After Hours)      Take the prescriptions as directed.  Call your Neurologist and your regular medical doctor today to schedule a follow up appointment within the next week.  Return to the Emergency Department immediately sooner if worsening.

## 2015-04-02 NOTE — ED Provider Notes (Signed)
CSN: [id number]     Arrival date & time 04/02/15  [id number] History   First MD Initiated Contact with Patient 04/02/15 1217     Chief Complaint  Patient presents with  . Seizures  . Back Pain      HPI Pt was seen at 1230. Per pt, c/o sudden onset and resolution of several intermittent episodes of "seizures" for the past 2 days. Pt states she ran out of her depakote approximately 1 month ago. Pt has not called her Neuro Dr. Merlene Laughter for refills. States she has "an appt next month" with him. Pt also c/o gradual onset and persistence of constant acute flair of her chronic low back "pain" for the past several days and is requesting a refill of her chronic narcotic pain medications.  Denies any change in her usual chronic pain pattern.  Pain worsens with palpation of the area and body position changes. Denies incont/retention of bowel or bladder, no saddle anesthesia, no focal motor weakness, no tingling/numbness in extremities, no fevers, no direct injury, no abd pain. The patient has a significant history of similar symptoms previously, recently being evaluated for this complaint and multiple prior evals for same.     Past Medical History  Diagnosis Date  . Seizures ([place])   . Brain tumor ([place])   . Noncompliance with medication regimen   . Chronic back pain    Past Surgical History  Procedure Laterality Date  . Abdominal hysterectomy    . Tubal ligation      Social History  Substance Use Topics  . Smoking status: Current Every Day Smoker -- 0.50 packs/day for 12 years    Types: Cigarettes  . Smokeless tobacco: Never Used  . Alcohol Use: No     Comment: history   OB History    Gravida Para Term Preterm AB TAB SAB Ectopic Multiple Living   3 3 3       3      Review of Systems ROS: Statement: All systems negative except as marked or noted in the HPI; Constitutional: Negative for fever and chills. ; ; Eyes: Negative for eye pain, redness and discharge. ; ; ENMT: Negative for ear pain,  hoarseness, nasal congestion, sinus pressure and sore throat. ; ; Cardiovascular: Negative for chest pain, palpitations, diaphoresis, dyspnea and peripheral edema. ; ; Respiratory: Negative for cough, wheezing and stridor. ; ; Gastrointestinal: Negative for nausea, vomiting, diarrhea, abdominal pain, blood in stool, hematemesis, jaundice and rectal bleeding. . ; ; Genitourinary: Negative for dysuria, flank pain and hematuria. ; ; Musculoskeletal: +chronic LBP. Negative for neck pain. Negative for swelling and trauma.; ; Skin: Negative for pruritus, rash, abrasions, blisters, bruising and skin lesion.; ; Neuro: +seizure. Negative for headache, lightheadedness and neck stiffness. Negative for weakness, extremity weakness, paresthesias, and syncope.       Allergies  Penicillins  Home Medications   Prior to Admission medications   Medication Sig Start Date End Date Taking? Authorizing Provider  divalproex (DEPAKOTE) 500 MG DR tablet Take 1 tablet (500 mg total) by mouth once. Patient taking differently: Take 1,500 mg by mouth daily.  11/09/14  Yes Carmin Muskrat, MD  oxyCODONE-acetaminophen (PERCOCET/ROXICET) 5-325 MG per tablet Take 1 tablet by mouth every 4 (four) hours as needed for severe pain. 01/06/15  Yes Virgel Manifold, MD   BP 128/106 mmHg  Pulse 93  Temp(Src) 98 F (36.7 C) (Oral)  Resp 18  Ht 5\' 6"  (1.676 m)  Wt 172 lb (78.019 kg)  BMI 27.77  kg/m2  SpO2 100%  LMP 01/06/2015 Physical Exam  1235: Physical examination:  Nursing notes reviewed; Vital signs and O2 SAT reviewed;  Constitutional: Well developed, Well nourished, Well hydrated, In no acute distress; Head:  Normocephalic, atraumatic; Eyes: EOMI, PERRL, No scleral icterus; ENMT: Mouth and pharynx normal, Mucous membranes moist; Neck: Supple, Full range of motion, No lymphadenopathy; Cardiovascular: Regular rate and rhythm, No murmur, rub, or gallop; Respiratory: Breath sounds clear & equal bilaterally, No rales, rhonchi,  wheezes.  Speaking full sentences with ease, Normal respiratory effort/excursion; Chest: Nontender, Movement normal; Abdomen: Soft, Nontender, Nondistended, Normal bowel sounds; Genitourinary: No CVA tenderness; Extremities: Pulses normal, No tenderness, No edema, No calf edema or asymmetry.; Neuro: AA&Ox3, Major CN grossly intact.  Speech clear. No gross focal motor or sensory deficits in extremities. Climbs on and off stretcher easily by herself. Gait steady.; Skin: Color normal, Warm, Dry.   ED Course  Procedures (including critical care time) Labs Review   Imaging Review  I have personally reviewed and evaluated these images and lab results as part of my medical decision-making.   EKG Interpretation None      MDM  MDM Reviewed: previous chart, nursing note and vitals Reviewed previous: labs Interpretation: labs      Results for orders placed or performed during the hospital encounter of 04/02/15  Valproic acid level  Result Value Ref Range   Valproic Acid Lvl <10 (L) 50.0 - 100.0 ug/mL    1345:  Depakote level as above. Given dose in ED. Requesting refill of narcotic pain medication; explained ED was not the venue to refill narcotic pain medications. Pt verb understanding. States she is ready to go home now.  Pt encouraged to f/u with her PMD, Neuro MD, and Pain Management doctor for good continuity of care and control of her chronic medical conditions. Pt verb understanding.      Francine Graven, DO 04/04/15 2153

## 2015-06-25 ENCOUNTER — Emergency Department (HOSPITAL_COMMUNITY)
Admission: EM | Admit: 2015-06-25 | Discharge: 2015-06-25 | Disposition: A | Discharge status: Home / Self Care | Payer: Medicaid Other | Attending: Emergency Medicine | Admitting: Emergency Medicine

## 2015-06-25 ENCOUNTER — Emergency Department (HOSPITAL_COMMUNITY): Payer: Medicaid Other

## 2015-06-25 ENCOUNTER — Encounter (HOSPITAL_COMMUNITY): Payer: Self-pay | Admitting: Emergency Medicine

## 2015-06-25 DIAGNOSIS — Z880 Allergy status to penicillin: Secondary | ICD-10-CM | POA: Insufficient documentation

## 2015-06-25 DIAGNOSIS — F17210 Nicotine dependence, cigarettes, uncomplicated: Secondary | ICD-10-CM | POA: Diagnosis not present

## 2015-06-25 DIAGNOSIS — G8929 Other chronic pain: Secondary | ICD-10-CM | POA: Insufficient documentation

## 2015-06-25 DIAGNOSIS — Z85841 Personal history of malignant neoplasm of brain: Secondary | ICD-10-CM | POA: Insufficient documentation

## 2015-06-25 DIAGNOSIS — G40909 Epilepsy, unspecified, not intractable, without status epilepticus: Secondary | ICD-10-CM | POA: Insufficient documentation

## 2015-06-25 DIAGNOSIS — M545 Low back pain: Secondary | ICD-10-CM | POA: Insufficient documentation

## 2015-06-25 DIAGNOSIS — R569 Unspecified convulsions: Secondary | ICD-10-CM | POA: Diagnosis present

## 2015-06-25 DIAGNOSIS — Z9119 Patient's noncompliance with other medical treatment and regimen: Secondary | ICD-10-CM | POA: Insufficient documentation

## 2015-06-25 LAB — BASIC METABOLIC PANEL
ANION GAP: 7 (ref 5–15)
BUN: 14 mg/dL (ref 6–20)
CALCIUM: 8.5 mg/dL — AB (ref 8.9–10.3)
CHLORIDE: 102 mmol/L (ref 101–111)
CO2: 27 mmol/L (ref 22–32)
Creatinine, Ser: 1.16 mg/dL — ABNORMAL HIGH (ref 0.44–1.00)
GFR calc non Af Amer: 59 mL/min — ABNORMAL LOW (ref >60)
Glucose, Bld: 110 mg/dL — ABNORMAL HIGH (ref 65–99)
Potassium: 3.6 mmol/L (ref 3.5–5.1)
Sodium: 136 mmol/L (ref 135–145)

## 2015-06-25 LAB — CBC WITH DIFFERENTIAL/PLATELET
BASOS ABS: 0 10*3/uL (ref 0.0–0.1)
Basophils Relative: 0 %
EOS ABS: 0.2 10*3/uL (ref 0.0–0.7)
Eosinophils Relative: 3 %
HCT: 35.9 % — ABNORMAL LOW (ref 36.0–46.0)
HEMOGLOBIN: 12.1 g/dL (ref 12.0–15.0)
LYMPHS ABS: 2.9 10*3/uL (ref 0.7–4.0)
LYMPHS PCT: 37 %
MCH: 31.2 pg (ref 26.0–34.0)
MCHC: 33.7 g/dL (ref 30.0–36.0)
MCV: 92.5 fL (ref 78.0–100.0)
Monocytes Absolute: 1 10*3/uL (ref 0.1–1.0)
Monocytes Relative: 13 %
NEUTROS PCT: 47 %
Neutro Abs: 3.7 10*3/uL (ref 1.7–7.7)
Platelets: 204 10*3/uL (ref 150–400)
RBC: 3.88 MIL/uL (ref 3.87–5.11)
RDW: 17.1 % — ABNORMAL HIGH (ref 11.5–15.5)
WBC: 7.8 10*3/uL (ref 4.0–10.5)

## 2015-06-25 LAB — RAPID URINE DRUG SCREEN, HOSP PERFORMED
AMPHETAMINES: NOT DETECTED
BENZODIAZEPINES: NOT DETECTED
Barbiturates: NOT DETECTED
Cocaine: POSITIVE — AB
OPIATES: NOT DETECTED
TETRAHYDROCANNABINOL: POSITIVE — AB

## 2015-06-25 LAB — URINALYSIS, ROUTINE W REFLEX MICROSCOPIC
BILIRUBIN URINE: NEGATIVE
Glucose, UA: NEGATIVE mg/dL
HGB URINE DIPSTICK: NEGATIVE
KETONES UR: NEGATIVE mg/dL
Leukocytes, UA: NEGATIVE
NITRITE: NEGATIVE
PH: 5.5 (ref 5.0–8.0)
Protein, ur: NEGATIVE mg/dL
Specific Gravity, Urine: 1.02 (ref 1.005–1.030)

## 2015-06-25 LAB — PREGNANCY, URINE: Preg Test, Ur: NEGATIVE

## 2015-06-25 LAB — VALPROIC ACID LEVEL: Valproic Acid Lvl: <10 ug/mL — ABNORMAL LOW (ref 50.0–100.0)

## 2015-06-25 MED ORDER — DIVALPROEX SODIUM 500 MG PO DR TAB: 500.0000 mg | DELAYED_RELEASE_TABLET | Freq: Three times a day (TID) | ORAL | Status: DC

## 2015-06-25 MED ORDER — VALPROATE SODIUM 500 MG/5ML IV SOLN
1000.0000 mg | Freq: Once | INTRAVENOUS | Status: AC
  Administered 2015-06-25: 1000 mg via INTRAVENOUS
  Filled 2015-06-25: qty 10

## 2015-06-25 NOTE — ED Notes (Signed)
MD at bedside. 

## 2015-06-25 NOTE — ED Provider Notes (Signed)
CSN: [id number]     Arrival date & time 06/25/15  1414 History   First MD Initiated Contact with Patient 06/25/15 1434     Chief Complaint  Patient presents with  . Seizures     (Consider location/radiation/quality/duration/timing/severity/associated sxs/prior Treatment) HPI Comments: Patient reports that she had 3 "seizures" today and "they're witnessed by her boyfriend. States she was "shaking all over" and each one lasted about 5-7 minutes. History of seizure disorder on Depakote. States she's not had her Depakote since February 22. She had been incarcerated for 90 days and discharged on February 22. States she had multiple seizures while she was in jail. 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 She is known to have history of noncompliance. Denies fever, chills, nausea or vomiting. States she did bite her tongue. No incontinence. She has chronic back pain at baseline that is unchanged today. No focal weakness, numbness or tingling. Admits to crack use 2 days ago. No chest pain or shortness of breath.  The history is provided by the patient. The history is limited by the condition of the patient.    Past Medical History  Diagnosis Date  . Seizures ([place])   . Brain tumor ([place])   . Noncompliance with medication regimen   . Chronic back pain    Past Surgical History  Procedure Laterality Date  . Abdominal hysterectomy    . Tubal ligation     History reviewed. No pertinent family history. Social History  Substance Use Topics  . Smoking status: Current Every Day Smoker -- 0.50 packs/day for 12 years    Types: Cigarettes  . Smokeless tobacco: Never Used  . Alcohol Use: No     Comment: history   OB History    Gravida Para Term Preterm AB TAB SAB Ectopic Multiple Living   3 3 3       3      Review of Systems  Constitutional: Negative for fever, activity change and appetite change.  HENT: Negative for congestion and rhinorrhea.   Eyes: Negative for  visual disturbance.  Respiratory: Negative for cough, chest tightness and shortness of breath.   Cardiovascular: Negative for chest pain and leg swelling.  Gastrointestinal: Negative for nausea, vomiting and abdominal pain.  Genitourinary: Negative for dysuria, vaginal bleeding and vaginal discharge.  Musculoskeletal: Positive for back pain. Negative for myalgias and arthralgias.  Skin: Negative for rash.  Neurological: Positive for seizures. Negative for weakness and headaches.  A complete 10 system review of systems was obtained and all systems are negative except as noted in the HPI and PMH.      Allergies  Penicillins  Home Medications   Prior to Admission medications   Medication Sig Start Date End Date Taking? Authorizing Provider  divalproex (DEPAKOTE) 500 MG DR tablet Take 1 tablet (500 mg total) by mouth 3 (three) times daily. 06/25/15   Ezequiel Essex, MD  methocarbamol (ROBAXIN) 500 MG tablet Take 2 tablets (1,000 mg total) by mouth 4 (four) times daily as needed for muscle spasms (muscle spasm/pain). Patient not taking: Reported on 06/25/2015 04/02/15   Francine Graven, DO  naproxen (NAPROSYN) 250 MG tablet Take 1 tablet (250 mg total) by mouth 2 (two) times daily as needed for mild pain or moderate pain (take with food). Patient not taking: Reported on 06/25/2015 04/02/15   Francine Graven, DO  oxyCODONE-acetaminophen (PERCOCET/ROXICET) 5-325 MG per tablet Take 1 tablet by mouth every 4 (four) hours as needed for severe  pain. Patient not taking: Reported on 06/25/2015 01/06/15   Virgel Manifold, MD   BP 132/79 mmHg  Pulse 90  Temp(Src) 97.8 F (36.6 C) (Oral)  Resp 23  Ht 5\' 6"  (1.676 m)  Wt 190 lb (86.183 kg)  BMI 30.68 kg/m2  SpO2 100%  LMP 01/06/2015 Physical Exam  Constitutional: She is oriented to person, place, and time. She appears well-developed and well-nourished. No distress.  HENT:  Head: Normocephalic and atraumatic.  Mouth/Throat: Oropharynx is clear and  moist. No oropharyngeal exudate.  No tongue trauma  Eyes: Conjunctivae and EOM are normal. Pupils are equal, round, and reactive to light.  Neck: Normal range of motion. Neck supple.  No C spine tenderness  Cardiovascular: Normal rate, regular rhythm, normal heart sounds and intact distal pulses.   No murmur heard. Pulmonary/Chest: Effort normal and breath sounds normal. No respiratory distress.  Abdominal: Soft. There is no tenderness. There is no rebound and no guarding.  Musculoskeletal: Normal range of motion. She exhibits no edema or tenderness.  Neurological: She is alert and oriented to person, place, and time. No cranial nerve deficit. She exhibits normal muscle tone. Coordination normal.  No pronator drift. 5/5 strength throughout. CN 2-12 intact.Equal grip strength. Sensation intact.   Skin: Skin is warm.  Psychiatric: She has a normal mood and affect. Her behavior is normal.  Nursing note and vitals reviewed.   ED Course  Procedures (including critical care time) Labs Review Labs Reviewed  BASIC METABOLIC PANEL - Abnormal; Notable for the following:    Glucose, Bld 110 (*)    Creatinine, Ser 1.16 (*)    Calcium 8.5 (*)    GFR calc non Af Amer 59 (*)    All other components within normal limits  VALPROIC ACID LEVEL - Abnormal; Notable for the following:    Valproic Acid Lvl <10 (*)    All other components within normal limits  CBC WITH DIFFERENTIAL/PLATELET - Abnormal; Notable for the following:    HCT 35.9 (*)    RDW 17.1 (*)    All other components within normal limits  URINE RAPID DRUG SCREEN, HOSP PERFORMED - Abnormal; Notable for the following:    Cocaine POSITIVE (*)    Tetrahydrocannabinol POSITIVE (*)    All other components within normal limits  URINALYSIS, ROUTINE W REFLEX MICROSCOPIC (NOT AT [facility])  PREGNANCY, URINE  CBG MONITORING, ED    Imaging Review Ct Head Wo Contrast  06/25/2015  CLINICAL DATA:  Seizure.  History of left upper AVM. EXAM: CT HEAD  WITHOUT CONTRAST TECHNIQUE: Contiguous axial images were obtained from the base of the skull through the vertex without intravenous contrast. COMPARISON:  06/20/2011 head CT. FINDINGS: Re- demonstrated are clustered coarse calcifications in the anterior left temporal lobe, which appear increased since 06/20/2011. No evidence of parenchymal hemorrhage or extra-axial fluid collection. No mass effect or midline shift. No CT evidence of acute infarction. Cerebral volume is age appropriate. No ventriculomegaly. The visualized paranasal sinuses are essentially clear. The mastoid air cells are unopacified. No evidence of calvarial fracture. There is slight motion artifact in the region of the visualized nasal bones. IMPRESSION: 1. No evidence of acute intracranial abnormality. No calvarial fracture. 2. 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 Electronically Signed   By: Ilona Sorrel M.D.   On: 06/25/2015 16:13   I have personally reviewed and evaluated these images  and lab results as part of my medical decision-making.   EKG Interpretation   Date/Time:  Monday June 25 2015 14:17:48 EST Ventricular Rate:  84 PR Interval:  108 QRS Duration: 86 QT Interval:  388 QTC Calculation: 458 R Axis:   78 Text Interpretation:  Sinus rhythm with short PR Otherwise normal ECG No  significant change was found Confirmed by Wyvonnia Dusky  MD, Leevi Cullars [phone]) on  06/25/2015 2:29:47 PM      MDM   Final diagnoses:  Seizure disorder ([place])   Seizure with known seizure disorder. Nonfocal neurological exam. Admits to noncompliance with Depakote.  neurologically stable. Depakote level undetectable. CT head shows stable calcifications or left temporal lobe had site of known AVM.  Discussed with Dr. Merlene Laughter. He recommends loading dose of 1 g of valproic acid and restarting  patient's home medications.  VPA given and prescription refilled.  Patient tolerating PO and ambulatory.  Advised not to drive and abstain from illicit drug use. Followup with Dr. Merlene Laughter. Return precautions discussed.   Ezequiel Essex, MD 06/25/15 [phone]

## 2015-06-25 NOTE — ED Notes (Signed)
Patient eating when she returned from CT. Advised patient NPO until result are back and spoken with MD. Patient verbazlies understanding.

## 2015-06-25 NOTE — ED Notes (Signed)
Pt reports 3 seizures today due to brain tumor, bit tongue, unknown as to how long seizures lasted.  Pt states she is supposed to have surgery but does not know when.

## 2015-06-25 NOTE — ED Notes (Signed)
Patient ambulated to restroom without difficulty.

## 2015-06-25 NOTE — Discharge Instructions (Signed)
Epilepsy Take your seizure medications as prescribed and follow up with Dr. Merlene Laughter. Stop using drugs. Return to the ED if you develop new or worsening symptoms. Epilepsy is a disorder in which a person has repeated seizures over time. A seizure is a release of abnormal electrical activity in the brain. Seizures can cause a change in attention, behavior, or the ability to remain awake and alert (altered mental status). Seizures often involve uncontrollable shaking (convulsions).  Most people with epilepsy lead normal lives. However, people with epilepsy are at an increased risk of falls, accidents, and injuries. Therefore, it is important to begin treatment right away. CAUSES  Epilepsy has many possible causes. Anything that disturbs the normal pattern of brain cell activity can lead to seizures. This may include:   Head injury.  Birth trauma.  High fever as a child.  Stroke.  Bleeding into or around the brain.  Certain drugs.  Prolonged low oxygen, such as what occurs after CPR efforts.  Abnormal brain development.  Certain illnesses, such as meningitis, encephalitis (brain infection), malaria, and other infections.  An imbalance of nerve signaling chemicals (neurotransmitters).  SIGNS AND SYMPTOMS  The symptoms of a seizure can vary greatly from one person to another. Right before a seizure, you may have a warning (aura) that a seizure is about to occur. An aura may include the following symptoms:  Fear or anxiety.  Nausea.  Feeling like the room is spinning (vertigo).  Vision changes, such as seeing flashing lights or spots. Common symptoms during a seizure include:  Abnormal sensations, such as an abnormal smell or a bitter taste in the mouth.   Sudden, general body stiffness.   Convulsions that involve rhythmic jerking of the face, arm, or leg on one or both sides.   Sudden change in consciousness.   Appearing to be awake but not responding.   Appearing to  be asleep but cannot be awakened.   Grimacing, chewing, lip smacking, drooling, tongue biting, or loss of bowel or bladder control. After a seizure, you may feel sleepy for a while. DIAGNOSIS  Your health care provider will ask about your symptoms and take a medical history. Descriptions from any witnesses to your seizures will be very helpful in the diagnosis. A physical exam, including a detailed neurological exam, is necessary. Various tests may be done, such as:   An electroencephalogram (EEG). This is a painless test of your brain waves. In this test, a diagram is created of your brain waves. These diagrams can be interpreted by a specialist.  An MRI of the brain.   A CT scan of the brain.   A spinal tap (lumbar puncture, LP).  Blood tests to check for signs of infection or abnormal blood chemistry. TREATMENT  There is no cure for epilepsy, but it is generally treatable. Once epilepsy is diagnosed, it is important to begin treatment as soon as possible. For most people with epilepsy, seizures can be controlled with medicines. The following may also be used:  A pacemaker for the brain (vagus nerve stimulator) can be used for people with seizures that are not well controlled by medicine.  Surgery on the brain. For some people, epilepsy eventually goes away. HOME CARE INSTRUCTIONS   Follow your health care provider's recommendations on driving and safety in normal activities.  Get enough rest. Lack of sleep can cause seizures.  Only take over-the-counter or prescription medicines as directed by your health care provider. Take any prescribed medicine exactly as directed.  Avoid any known triggers of your seizures.  Keep a seizure diary. Record what you recall about any seizure, especially any possible trigger.   Make sure the people you live and work with know that you are prone to seizures. They should receive instructions on how to help you. In general, a witness to a  seizure should:   Cushion your head and body.   Turn you on your side.   Avoid unnecessarily restraining you.   Not place anything inside your mouth.   Call for emergency medical help if there is any question about what has occurred.   Follow up with your health care provider as directed. You may need regular blood tests to monitor the levels of your medicine.  SEEK MEDICAL CARE IF:   You develop signs of infection or other illness. This might increase the risk of a seizure.   You seem to be having more frequent seizures.   Your seizure pattern is changing.  SEEK IMMEDIATE MEDICAL CARE IF:   You have a seizure that does not stop after a few moments.   You have a seizure that causes any difficulty in breathing.   You have a seizure that results in a very severe headache.   You have a seizure that leaves you with the inability to speak or use a part of your body.    This information is not intended to replace advice given to you by your health care provider. Make sure you discuss any questions you have with your health care provider.   Document Released: 04/14/2005 Document Revised: 02/02/2013 Document Reviewed: 11/24/2012 Elsevier Interactive Patient Education [employer].

## 2015-10-29 ENCOUNTER — Emergency Department (HOSPITAL_COMMUNITY)
Admission: EM | Admit: 2015-10-29 | Discharge: 2015-10-29 | Disposition: A | Discharge status: Home / Self Care | Payer: Medicaid Other | Attending: Emergency Medicine | Admitting: Emergency Medicine

## 2015-10-29 ENCOUNTER — Encounter (HOSPITAL_COMMUNITY): Payer: Self-pay | Admitting: Emergency Medicine

## 2015-10-29 DIAGNOSIS — Z79899 Other long term (current) drug therapy: Secondary | ICD-10-CM | POA: Diagnosis not present

## 2015-10-29 DIAGNOSIS — F17210 Nicotine dependence, cigarettes, uncomplicated: Secondary | ICD-10-CM | POA: Diagnosis not present

## 2015-10-29 DIAGNOSIS — R569 Unspecified convulsions: Secondary | ICD-10-CM

## 2015-10-29 DIAGNOSIS — G40909 Epilepsy, unspecified, not intractable, without status epilepticus: Secondary | ICD-10-CM | POA: Insufficient documentation

## 2015-10-29 LAB — BASIC METABOLIC PANEL
ANION GAP: 8 (ref 5–15)
BUN: 13 mg/dL (ref 6–20)
CO2: 28 mmol/L (ref 22–32)
Calcium: 9.1 mg/dL (ref 8.9–10.3)
Chloride: 100 mmol/L — ABNORMAL LOW (ref 101–111)
Creatinine, Ser: 0.87 mg/dL (ref 0.44–1.00)
Glucose, Bld: 94 mg/dL (ref 65–99)
POTASSIUM: 4.9 mmol/L (ref 3.5–5.1)
SODIUM: 136 mmol/L (ref 135–145)

## 2015-10-29 LAB — CBC WITH DIFFERENTIAL/PLATELET
BASOS ABS: 0 10*3/uL (ref 0.0–0.1)
Basophils Relative: 1 %
EOS ABS: 0.2 10*3/uL (ref 0.0–0.7)
EOS PCT: 3 %
HCT: 38.3 % (ref 36.0–46.0)
Hemoglobin: 12.9 g/dL (ref 12.0–15.0)
LYMPHS PCT: 32 %
Lymphs Abs: 1.9 10*3/uL (ref 0.7–4.0)
MCH: 30.4 pg (ref 26.0–34.0)
MCHC: 33.7 g/dL (ref 30.0–36.0)
MCV: 90.3 fL (ref 78.0–100.0)
Monocytes Absolute: 0.7 10*3/uL (ref 0.1–1.0)
Monocytes Relative: 12 %
Neutro Abs: 3.1 10*3/uL (ref 1.7–7.7)
Neutrophils Relative %: 52 %
PLATELETS: 121 10*3/uL — AB (ref 150–400)
RBC: 4.24 MIL/uL (ref 3.87–5.11)
RDW: 16.3 % — ABNORMAL HIGH (ref 11.5–15.5)
WBC: 5.9 10*3/uL (ref 4.0–10.5)

## 2015-10-29 LAB — CBG MONITORING, ED: GLUCOSE-CAPILLARY: 83 mg/dL (ref 65–99)

## 2015-10-29 LAB — VALPROIC ACID LEVEL: VALPROIC ACID LVL: 94 ug/mL (ref 50.0–100.0)

## 2015-10-29 MED ORDER — LEVETIRACETAM 500 MG PO TABS: 500.0000 mg | ORAL_TABLET | Freq: Two times a day (BID) | ORAL | Status: DC

## 2015-10-29 NOTE — ED Notes (Signed)
MD at bedside. 

## 2015-10-29 NOTE — ED Notes (Signed)
Pt taken to bathroom by sheriff and given washcloths with soap and towel to clean up self from episode of incontinence.

## 2015-10-29 NOTE — ED Notes (Signed)
Sheriff requesting for nurse to call jail nurse to see about follow up appt.

## 2015-10-29 NOTE — ED Notes (Signed)
Spoke to jail nurse,  told to give referral (already given) and they will be able to make arrangements for pt to be seen in office. Spoke to dawn.  PT updated.

## 2015-10-29 NOTE — ED Provider Notes (Signed)
CSN: [id number]     Arrival date & time 10/29/15  1236 History   First MD Initiated Contact with Patient 10/29/15 1306     Chief Complaint  Patient presents with  . Seizures     (Consider location/radiation/quality/duration/timing/severity/associated sxs/prior Treatment) HPI Comments: 40 year-old female with history of smoking, currently in [place] presents after likely seizure episode. Patient says she's had 9 the past month. Patient has been compliant with Depakote 1500 mg per her report. Patient has been more sleepy recently. No fevers or chills. No other medications. Patient has history of a brain tumor nonmalignant per her report.  The history is provided by the patient and medical records.    Past Medical History  Diagnosis Date  . Seizures ([place])   . Brain tumor ([place])   . Noncompliance with medication regimen   . Chronic back pain    Past Surgical History  Procedure Laterality Date  . Abdominal hysterectomy    . Tubal ligation     History reviewed. No pertinent family history. Social History  Substance Use Topics  . Smoking status: Current Every Day Smoker -- 0.50 packs/day for 12 years    Types: Cigarettes  . Smokeless tobacco: Never Used  . Alcohol Use: No     Comment: history   OB History    Gravida Para Term Preterm AB TAB SAB Ectopic Multiple Living   3 3 3       3      Review of Systems    Allergies  Penicillins  Home Medications   Prior to Admission medications   Medication Sig Start Date End Date Taking? Authorizing Provider  divalproex (DEPAKOTE) 500 MG DR tablet Take 1 tablet (500 mg total) by mouth 3 (three) times daily. Patient taking differently: Take 500 mg by mouth daily.  06/25/15  Yes Ezequiel Essex, MD  divalproex (DEPAKOTE) 500 MG DR tablet Take 1,000 mg by mouth at bedtime.   Yes Historical Provider, MD  levETIRAcetam (KEPPRA) 500 MG tablet Take 1 tablet (500 mg total) by mouth 2 (two) times daily. 10/29/15   Elnora Morrison,  MD   BP 98/59 mmHg  Pulse 81  Temp(Src) 97.9 F (36.6 C) (Oral)  Resp 15  Ht 5\' 6"  (1.676 m)  Wt 170 lb (77.111 kg)  BMI 27.45 kg/m2  SpO2 96%  LMP 01/06/2015 Physical Exam  Constitutional: She is oriented to person, place, and time. She appears well-developed and well-nourished.  HENT:  Head: Normocephalic and atraumatic.  Eyes: Conjunctivae are normal. Right eye exhibits no discharge. Left eye exhibits no discharge.  Neck: Normal range of motion. Neck supple. No tracheal deviation present.  Cardiovascular: Normal rate and regular rhythm.   Pulmonary/Chest: Effort normal and breath sounds normal.  Abdominal: Soft. She exhibits no distension. There is no tenderness. There is no guarding.  Musculoskeletal: She exhibits no edema.  Neurological: She is alert and oriented to person, place, and time. GCS eye subscore is 4. GCS verbal subscore is 5. GCS motor subscore is 6.  5+ strength in UE and LE with f/e at major joints. Sensation to palpation intact in UE and LE. CNs 2-12 grossly intact.  EOMFI.  PERRL.     Visual fields intact No nystagmus   Skin: Skin is warm. No rash noted.  Psychiatric: She has a normal mood and affect.  Nursing note and vitals reviewed.   ED Course  Procedures (including critical care time) Labs Review Labs Reviewed  BASIC METABOLIC PANEL -  Abnormal; Notable for the following:    Chloride 100 (*)    All other components within normal limits  CBC WITH DIFFERENTIAL/PLATELET - Abnormal; Notable for the following:    RDW 16.3 (*)    Platelets 121 (*)    All other components within normal limits  VALPROIC ACID LEVEL  CBG MONITORING, ED    Imaging Review No results found. I have personally reviewed and evaluated these images and lab results as part of my medical decision-making.   EKG Interpretation None     EKG reviewed heart rate 93, sinus, normal QT, no acute findings MDM   Final diagnoses:  Seizure-like activity ([place])   Patient with  seizure history presents after likely seizure activity earlier today. Patient did have incontinence. Patient returned to baseline, normal neurologic exam. No fever in the ER. Discussed that patient will need to see neurology outpatient arrangement with the facility. Plan for observation ER, checking her antiepileptic level and basic blood work.  No seizure activity in the ER. Antiepileptic medicine therapeutic. Plan to start [place] and outpatient follow-up arranged with neurology. Results and differential diagnosis were discussed with the patient/parent/guardian. Xrays were independently reviewed by myself.  Close follow up outpatient was discussed, comfortable with the plan.   Medications - No data to display  Filed Vitals:   10/29/15 1243 10/29/15 1300 10/29/15 1330  BP: 103/57 98/62 98/59   Pulse: 90 84 81  Temp: 97.9 F (36.6 C)    TempSrc: Oral    Resp: 18 17 15   Height: 5\' 6"  (1.676 m)    Weight: 170 lb (77.111 kg)    SpO2: 95% 98% 96%    Final diagnoses:  Seizure-like activity ([place])        Elnora Morrison, MD 10/29/15 1527

## 2015-10-29 NOTE — Discharge Instructions (Signed)
Please have patient seen by neurology in the next two weeks to adjust medications.  If you were given medicines take as directed.  If you are on coumadin or contraceptives realize their levels and effectiveness is altered by many different medicines.  If you have any reaction (rash, tongues swelling, other) to the medicines stop taking and see a physician.    If your blood pressure was elevated in the ER make sure you follow up for management with a primary doctor or return for chest pain, shortness of breath or stroke symptoms.  Please follow up as directed and return to the ER or see a physician for new or worsening symptoms.  Thank you. Filed Vitals:   10/29/15 1243 10/29/15 1300 10/29/15 1330  BP: 103/57 98/62 98/59   Pulse: 90 84 81  Temp: 97.9 F (36.6 C)    TempSrc: Oral    Resp: 18 17 15   Height: 5\' 6"  (1.676 m)    Weight: 170 lb (77.111 kg)    SpO2: 95% 98% 96%

## 2015-10-29 NOTE — ED Notes (Signed)
Pt made aware to return if symptoms worsen or if any life threatening symptoms occur.   

## 2015-10-29 NOTE — ED Notes (Addendum)
Per EMS: Pt from [facility] jail, had seizure today,  post ictal on ems arrival, incontinence noted.  Pt has hx seizures and has taken medication (depakote 1500mg ). Pt reports that she has had increased lethargy in the past month and has been sleeping more often. Pt reports last seizure was one month ago.  Sheriff at bedside with pt.  cbg 86, 114/60

## 2016-04-08 ENCOUNTER — Emergency Department (HOSPITAL_COMMUNITY)
Admission: EM | Admit: 2016-04-08 | Discharge: 2016-04-08 | Disposition: A | Discharge status: Home / Self Care | Payer: Medicaid Other | Attending: Emergency Medicine | Admitting: Emergency Medicine

## 2016-04-08 ENCOUNTER — Encounter (HOSPITAL_COMMUNITY): Payer: Self-pay | Admitting: Emergency Medicine

## 2016-04-08 DIAGNOSIS — Z8541 Personal history of malignant neoplasm of cervix uteri: Secondary | ICD-10-CM | POA: Insufficient documentation

## 2016-04-08 DIAGNOSIS — N938 Other specified abnormal uterine and vaginal bleeding: Secondary | ICD-10-CM | POA: Diagnosis not present

## 2016-04-08 DIAGNOSIS — Z87891 Personal history of nicotine dependence: Secondary | ICD-10-CM | POA: Diagnosis not present

## 2016-04-08 DIAGNOSIS — Z85841 Personal history of malignant neoplasm of brain: Secondary | ICD-10-CM | POA: Insufficient documentation

## 2016-04-08 DIAGNOSIS — N939 Abnormal uterine and vaginal bleeding, unspecified: Secondary | ICD-10-CM | POA: Diagnosis present

## 2016-04-08 HISTORY — DX: Malignant (primary) neoplasm, unspecified: C80.1

## 2016-04-08 LAB — CBC WITH DIFFERENTIAL/PLATELET
Basophils Absolute: 0 10*3/uL (ref 0.0–0.1)
Basophils Relative: 0 %
Eosinophils Absolute: 0.1 10*3/uL (ref 0.0–0.7)
Eosinophils Relative: 2 %
HCT: 30.8 % — ABNORMAL LOW (ref 36.0–46.0)
Hemoglobin: 9.9 g/dL — ABNORMAL LOW (ref 12.0–15.0)
Lymphocytes Relative: 31 %
Lymphs Abs: 2.3 10*3/uL (ref 0.7–4.0)
MCH: 26.2 pg (ref 26.0–34.0)
MCHC: 32.1 g/dL (ref 30.0–36.0)
MCV: 81.5 fL (ref 78.0–100.0)
Monocytes Absolute: 0.8 10*3/uL (ref 0.1–1.0)
Monocytes Relative: 11 %
Neutro Abs: 4.1 10*3/uL (ref 1.7–7.7)
Neutrophils Relative %: 56 %
Platelets: 232 10*3/uL (ref 150–400)
RBC: 3.78 MIL/uL — ABNORMAL LOW (ref 3.87–5.11)
RDW: 13.4 % (ref 11.5–15.5)
WBC: 7.3 10*3/uL (ref 4.0–10.5)

## 2016-04-08 LAB — URINALYSIS, ROUTINE W REFLEX MICROSCOPIC
Bacteria, UA: NONE SEEN
Bilirubin Urine: NEGATIVE
Glucose, UA: NEGATIVE mg/dL
Ketones, ur: NEGATIVE mg/dL
Leukocytes, UA: NEGATIVE
Nitrite: NEGATIVE
Protein, ur: NEGATIVE mg/dL
Specific Gravity, Urine: 1.015 (ref 1.005–1.030)
pH: 5 (ref 5.0–8.0)

## 2016-04-08 LAB — WET PREP, GENITAL
Sperm: NONE SEEN
Trich, Wet Prep: NONE SEEN
WBC, Wet Prep HPF POC: NONE SEEN
Yeast Wet Prep HPF POC: NONE SEEN

## 2016-04-08 LAB — BASIC METABOLIC PANEL
Anion gap: 8 (ref 5–15)
BUN: 14 mg/dL (ref 6–20)
CO2: 25 mmol/L (ref 22–32)
Calcium: 9.3 mg/dL (ref 8.9–10.3)
Chloride: 103 mmol/L (ref 101–111)
Creatinine, Ser: 0.71 mg/dL (ref 0.44–1.00)
GFR calc Af Amer: >60 mL/min (ref >60)
GFR calc non Af Amer: >60 mL/min (ref >60)
Glucose, Bld: 99 mg/dL (ref 65–99)
Potassium: 3.9 mmol/L (ref 3.5–5.1)
Sodium: 136 mmol/L (ref 135–145)

## 2016-04-08 LAB — PREGNANCY, URINE: Preg Test, Ur: NEGATIVE

## 2016-04-08 MED ORDER — MEDROXYPROGESTERONE ACETATE 10 MG PO TABS
10.0000 mg | ORAL_TABLET | Freq: Once | ORAL | Status: AC
  Administered 2016-04-08: 10 mg via ORAL
  Filled 2016-04-08: qty 1

## 2016-04-08 MED ORDER — IBUPROFEN 800 MG PO TABS
800.0000 mg | ORAL_TABLET | Freq: Once | ORAL | Status: AC
  Administered 2016-04-08: 800 mg via ORAL
  Filled 2016-04-08: qty 1

## 2016-04-08 MED ORDER — MEDROXYPROGESTERONE ACETATE 10 MG PO TABS: 10.0000 mg | ORAL_TABLET | Freq: Every day | ORAL | 0 refills | Status: DC

## 2016-04-08 MED ORDER — MEDROXYPROGESTERONE ACETATE 10 MG PO TABS
ORAL_TABLET | ORAL | Status: AC
  Filled 2016-04-08: qty 1

## 2016-04-08 NOTE — ED Notes (Signed)
Attempted to assess pt, but pt unable to provide a complete story.  Timeline does not stay consistent.  Pt initially said she has been bleeding for 9 weeks, but then stated "since I went to jail" which was 8 months ago.  Asked pt when she last had a normal cycle, and she stated "a few weeks ago."  Asked pt how this was if she was bleeding constantly, which she stated "I don't know, I have a brain tumor and can't remember things.  Deputy states she transported pt to dentist this morning and she denied any complaints.  States she told the deputy she liked [employer] because she got snacks, soda, and the crushed ice was like heaven.  Unsure how long pt has been having vaginal bleeding.  Denies pain at this time.

## 2016-04-08 NOTE — ED Triage Notes (Signed)
Pt reports heavy vaginal bleeding with large clots, dizziness and weakness. Pt states she has prior hx of cervical ca.

## 2016-04-08 NOTE — ED Notes (Signed)
Patient is resting comfortably. 

## 2016-04-08 NOTE — ED Provider Notes (Signed)
[facility] DEPT Provider Note   CSN: [id number] Arrival date & time: 04/08/16  1610 By signing my name below, I, Georgette Shell, attest that this documentation has been prepared under the direction and in the presence of Virgel Manifold, MD. Electronically Signed: Georgette Shell, ED Scribe. 04/08/16. 5:24 PM.  History   Chief Complaint Chief Complaint  Patient presents with  . Vaginal Bleeding   HPI Comments: [name] is a 40 y.o. female with h/o cervical cancer and brain tumor who presents to the Emergency Department from jail complaining of intermittent vaginal bleeding onset 9 weeks ago with associated sharp abdominal pain. She reports that her periods are usually regular, and that her bleeding at this time is significantly heavier and contains large clots.  Pt is also complaining of dizziness and generalized weakness onset yesterday. She was not given any OTC medications PTA. Pt is not on blood thinners. Pt denies fever, shortness of breath, or any other associated symptoms.  The history is provided by the patient. No language interpreter was used.    Past Medical History:  Diagnosis Date  . Brain tumor ([place])   . Cancer ([place])   . Chronic back pain   . Noncompliance with medication regimen   . Seizures ([place])     There are no active problems to display for this patient.   Past Surgical History:  Procedure Laterality Date  . ABDOMINAL HYSTERECTOMY    . TUBAL LIGATION      OB History    Gravida Para Term Preterm AB Living   3 3 3     3    SAB TAB Ectopic Multiple Live Births                   Home Medications    Prior to Admission medications   Medication Sig Start Date End Date Taking? Authorizing Provider  divalproex (DEPAKOTE) 500 MG DR tablet Take 1 tablet (500 mg total) by mouth 3 (three) times daily. Patient taking differently: Take 500 mg by mouth daily.  06/25/15   Ezequiel Essex, MD  divalproex (DEPAKOTE) 500 MG DR tablet Take 1,000 mg by mouth at  bedtime.    Historical Provider, MD  levETIRAcetam (KEPPRA) 500 MG tablet Take 1 tablet (500 mg total) by mouth 2 (two) times daily. 10/29/15   Elnora Morrison, MD    Family History History reviewed. No pertinent family history.  Social History Social History  Substance Use Topics  . Smoking status: Former Smoker    Packs/day: 0.50    Years: 12.00    Types: Cigarettes  . Smokeless tobacco: Never Used  . Alcohol use No     Comment: history     Allergies   Penicillins   Review of Systems Review of Systems  Constitutional: Negative for chills and fever.  Respiratory: Negative for shortness of breath.   Gastrointestinal: Positive for abdominal pain.  Genitourinary: Positive for vaginal bleeding.  Neurological: Positive for dizziness and weakness.  All other systems reviewed and are negative.    Physical Exam Updated Vital Signs BP 140/79 (BP Location: Left Arm)   Pulse 88   Temp 98.1 F (36.7 C) (Oral)   Resp 18   Ht 5\' 6"  (1.676 m)   Wt 200 lb (90.7 kg)   LMP 01/06/2015   SpO2 100%   BMI 32.28 kg/m   Physical Exam  Constitutional: She is oriented to person, place, and time. She appears well-developed and well-nourished. No distress.  HENT:  Head: Normocephalic  and atraumatic.  Nose: Nose normal.  Mouth/Throat: Oropharynx is clear and moist. No oropharyngeal exudate.  Eyes: Conjunctivae and EOM are normal. Pupils are equal, round, and reactive to light. No scleral icterus.  Neck: Normal range of motion. Neck supple. No JVD present. No tracheal deviation present. No thyromegaly present.  Cardiovascular: Normal rate, regular rhythm and normal heart sounds.  Exam reveals no gallop and no friction rub.   No murmur heard. Pulmonary/Chest: Effort normal and breath sounds normal. No respiratory distress. She has no wheezes. She exhibits no tenderness.  Abdominal: Soft. Bowel sounds are normal. She exhibits no distension and no mass. There is tenderness. There is no  rebound and no guarding.  Mild suprapubic tenderness.  Genitourinary:  Genitourinary Comments: Chaperone present throughout entire exam. No concerning sternal lesions noted. Normal external female genitalia. Moderate amount of dark blood in vaginal vault. No significant discharge otherwise. No significant adnexal mass or tenderness.  Musculoskeletal: Normal range of motion. She exhibits no edema or tenderness.  Lymphadenopathy:    She has no cervical adenopathy.  Neurological: She is alert and oriented to person, place, and time. No cranial nerve deficit. She exhibits normal muscle tone.  Skin: Skin is warm and dry. No rash noted. No erythema. No pallor.  Nursing note and vitals reviewed.    ED Treatments / Results  DIAGNOSTIC STUDIES: Oxygen Saturation is 100% on RA, normal by my interpretation.    COORDINATION OF CARE: 5:22 PM Discussed treatment plan with pt at bedside which includes pelvic exam and blood work and pt agreed to plan.  Labs (all labs ordered are listed, but only abnormal results are displayed) Labs Reviewed - No data to display  EKG  EKG Interpretation None       Radiology No results found.  Procedures Procedures (including critical care time)  Medications Ordered in ED Medications - No data to display   Initial Impression / Assessment and Plan / ED Course  I have reviewed the triage vital signs and the nursing notes.  Pertinent labs & imaging results that were available during my care of the patient were reviewed by me and considered in my medical decision making (see chart for details).  Clinical Course    40 year old female with dysfunctional uterine bleeding. She is anemic. She is hemodynamically stable though. She is not on any blood thinners. Her abdominal exam is benign. We'll give her a course of Provera. Return precautions were discussed.   Final Clinical Impressions(s) / ED Diagnoses   Final diagnoses:  DUB (dysfunctional uterine  bleeding)    New Prescriptions New Prescriptions   No medications on file    I personally preformed the services scribed in my presence. The recorded information has been reviewed is accurate. Virgel Manifold, MD.     Virgel Manifold, MD 04/15/16 [phone]

## 2016-04-09 LAB — GC/CHLAMYDIA PROBE AMP (~~LOC~~) NOT AT ARMC
Chlamydia: NEGATIVE
Neisseria Gonorrhea: NEGATIVE

## 2016-05-01 ENCOUNTER — Encounter: Payer: Self-pay | Admitting: Obstetrics & Gynecology

## 2016-05-08 ENCOUNTER — Encounter: Payer: Self-pay | Admitting: Obstetrics and Gynecology

## 2016-05-08 ENCOUNTER — Other Ambulatory Visit (HOSPITAL_COMMUNITY)
Admission: RE | Admit: 2016-05-08 | Discharge: 2016-05-08 | Disposition: A | Discharge status: Home / Self Care | Payer: Medicaid Other | Source: Ambulatory Visit | Attending: Obstetrics & Gynecology | Admitting: Obstetrics & Gynecology

## 2016-05-08 ENCOUNTER — Other Ambulatory Visit (HOSPITAL_COMMUNITY)
Admission: RE | Admit: 2016-05-08 | Discharge: 2016-05-08 | Disposition: A | Discharge status: Home / Self Care | Source: Ambulatory Visit | Attending: Obstetrics and Gynecology | Admitting: Obstetrics and Gynecology

## 2016-05-08 ENCOUNTER — Ambulatory Visit (INDEPENDENT_AMBULATORY_CARE_PROVIDER_SITE_OTHER): Payer: PRIVATE HEALTH INSURANCE | Admitting: Obstetrics and Gynecology

## 2016-05-08 VITALS — BP 122/71 | HR 89 | Wt 200.6 lb

## 2016-05-08 DIAGNOSIS — Z124 Encounter for screening for malignant neoplasm of cervix: Secondary | ICD-10-CM | POA: Diagnosis not present

## 2016-05-08 DIAGNOSIS — Z01411 Encounter for gynecological examination (general) (routine) with abnormal findings: Secondary | ICD-10-CM | POA: Insufficient documentation

## 2016-05-08 DIAGNOSIS — Z01419 Encounter for gynecological examination (general) (routine) without abnormal findings: Secondary | ICD-10-CM

## 2016-05-08 DIAGNOSIS — N938 Other specified abnormal uterine and vaginal bleeding: Secondary | ICD-10-CM

## 2016-05-08 DIAGNOSIS — Z1151 Encounter for screening for human papillomavirus (HPV): Secondary | ICD-10-CM | POA: Diagnosis not present

## 2016-05-08 NOTE — Progress Notes (Signed)
[facility] Visit  05/08/16           Patient name: [name] MRN [id number]  Date of birth: April 06, 1976  CC & HPI:  [name] is a 41 y.o. female presenting today for heavy bleeding x 12 weeks with associated clots. Pt notes that she hasn't bled in the past 8 days. Pt denies having any GYN care in "awhile". Pt notes that she was seen in the ED for her symptoms and Rx provera, that she completed 3 weeks ago. Pt denies any other symptoms. Pt is accompanied by officer; she's in jail.  ROS:  ROS +heavy bleeding finally pt states that she's not bled I 4 days, after completion of provera last week. Otherwise negative.   Pertinent History Reviewed:   Reviewed: Significant for CA, abdominal hysterectomy, seizures Medical         Past Medical History:  Diagnosis Date  . Brain tumor ([place])   . Cancer ([place])   . Chronic back pain   . Noncompliance with medication regimen   . Seizures ([place])                               Surgical Hx:    Past Surgical History:  Procedure Laterality Date  . ABDOMINAL HYSTERECTOMY    . TUBAL LIGATION     Medications: Reviewed & Updated - see associated section                       Current Outpatient Prescriptions:  .  levETIRAcetam (KEPPRA) 500 MG tablet, Take 1 tablet (500 mg total) by mouth 2 (two) times daily., Disp: 60 tablet, Rfl: 0 .  divalproex (DEPAKOTE) 500 MG DR tablet, Take 1 tablet (500 mg total) by mouth 3 (three) times daily. (Patient not taking: Reported on 05/08/2016), Disp: 90 tablet, Rfl: 0 .  divalproex (DEPAKOTE) 500 MG DR tablet, Take 1,000 mg by mouth at bedtime., Disp: , Rfl:  .  medroxyPROGESTERone (PROVERA) 10 MG tablet, Take 1 tablet (10 mg total) by mouth daily. (Patient not taking: Reported on 05/08/2016), Disp: 10 tablet, Rfl: 0   Social History: Reviewed -  reports that she has quit smoking. Her smoking use included Cigarettes. She has a 6.00 pack-year smoking history. She has never used smokeless  tobacco.  Objective Findings:  Vitals: Blood pressure 122/71, pulse 89, weight 200 lb 9.6 oz (91 kg), last menstrual period 01/06/2015.  Physical Examination:   Physical Examination:  Pelvic - normal external genitalia, vulva, vagina, cervix, uterus and adnexa, VULVA: normal appearing vulva with no masses, tenderness or lesions, VAGINA: normal appearing vagina with normal color and discharge, no lesions, CERVIX: normal appearing cervix without discharge or lesionsuterus is normal size, shape, consistency and non-tender. ADNEXA: normal adnexa in size, nontender and no masses SKIN: 8 mm skin tag to right thigh that is normal in appearance.   Assessment & Plan:   A:  1. AUB, resolved 2  P:  1. Pap smear collected today 2. Follow up PRN  By signing my name below, I, Soijett Blue, attest that this documentation has been prepared under the direction and in the presence of Jonnie Kind, MD. Electronically Signed: Soijett Blue, ED Scribe. 05/08/16. 4:14 PM.  I personally performed the services described in this documentation, which was SCRIBED in my presence. The recorded information has been reviewed and considered accurate. It has been edited  as necessary during review. Jonnie Kind, MD

## 2016-05-16 LAB — CYTOLOGY - PAP: HPV (WINDOPATH): DETECTED — AB

## 2016-05-19 ENCOUNTER — Telehealth: Payer: Self-pay | Admitting: *Deleted

## 2016-05-19 NOTE — Telephone Encounter (Signed)
Spoke with Jeral Fruit, nurse at jail. States she will inform patient and call back to schedule appointment for colposcopy.

## 2016-06-04 ENCOUNTER — Encounter: Payer: PRIVATE HEALTH INSURANCE | Admitting: Obstetrics and Gynecology

## 2016-06-06 ENCOUNTER — Other Ambulatory Visit: Payer: Self-pay | Admitting: Obstetrics and Gynecology

## 2016-06-06 ENCOUNTER — Ambulatory Visit (INDEPENDENT_AMBULATORY_CARE_PROVIDER_SITE_OTHER): Payer: PRIVATE HEALTH INSURANCE | Admitting: Obstetrics and Gynecology

## 2016-06-06 ENCOUNTER — Encounter: Payer: Self-pay | Admitting: Obstetrics and Gynecology

## 2016-06-06 VITALS — Wt 197.0 lb

## 2016-06-06 DIAGNOSIS — D060 Carcinoma in situ of endocervix: Secondary | ICD-10-CM

## 2016-06-06 DIAGNOSIS — Z118 Encounter for screening for other infectious and parasitic diseases: Secondary | ICD-10-CM

## 2016-06-06 DIAGNOSIS — Z1159 Encounter for screening for other viral diseases: Secondary | ICD-10-CM

## 2016-06-06 DIAGNOSIS — R87611 Atypical squamous cells cannot exclude high grade squamous intraepithelial lesion on cytologic smear of cervix (ASC-H): Secondary | ICD-10-CM

## 2016-06-06 NOTE — Progress Notes (Addendum)
  [name] 41 y.o. 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 pap smear on 05/08/16.  Discussed role for HPV in cervical dysplasia, need for surveillance. She reports having cervical treatments in the past. She also notes a history of an abdominal supracervical hysterectomy. Unknown year m, at [facility]  Pelvic exam: VULVA: normal appearing vulva with no masses, tenderness or lesions,  VAGINA:  mucous-like discharge mixed with small amount of blood   Patient given informed consent, signed copy in the chart, time out was performed.  Placed in lithotomy position. Cervix viewed with speculum and colposcope after application of acetic acid.   Colposcopy adequate? Yes cervix is slightly stenotic at external os. Able to pass ECC. White area on anterior cervix; biopsies obtained at 10 o'clock, 12 o'clock, and 2 o'clock at the anterior lip of the cervix.   ECC specimen obtained. All specimens were labelled and sent to pathology.   Colposcopy IMPRESSION: normal cervix, ECC bled generously with sample , will need to monitor with ECC now and 6 months. GC CHL collected.  Patient was given post procedure instructions. Will follow up pathology and manage accordingly.  Routine preventative health maintenance measures emphasized.  PLAN: Repeat in pap smear in 6 months  By signing my name below, I, Sonum Patel, attest that this documentation has been prepared under the direction and in the presence of Jonnie Kind, MD. Electronically Signed: Sonum Patel, Education administrator. 06/06/16. 10:45 AM.  I personally performed the services described in this documentation, which was SCRIBED in my presence. The recorded information has been reviewed and considered accurate. It has been edited as necessary during review. Jonnie Kind, MD

## 2016-06-06 NOTE — Patient Instructions (Addendum)
ciPap Test is needed in 6 months. If there are more abnormal results from today,more aggressive followup could be necessary. Results in a week. Please call us at [phone]            Why am I having this test? A pap test is sometimes called a pap smear. It is a screening test that is used to check for signs of cancer of the vagina, cervix, and uterus. The test can also identify the presence of infection or precancerous changes. Your health care provider will likely recommend you have this test done on a regular basis. This test may be done:  Every 3 years, starting at age 3.  Every 5 years, in combination with testing for the presence of human papillomavirus (HPV).  More or less often depending on other medical conditions. What kind of sample is taken? Using a small cotton swab, plastic spatula, or brush, your health care provider will collect a sample of cells from the surface of your cervix. Your cervix is the opening to your uterus, also called a womb. Secretions from the cervix and vagina may also be collected. How do I prepare for this test?  Be aware of where you are in your menstrual cycle. You may be asked to reschedule the test if you are menstruating on the day of the test.  You may need to reschedule if you have a known vaginal infection on the day of the test.  You may be asked to avoid douching or taking a bath the day before or the day of the test.  Some medicines can cause abnormal test results, such as digitalis and tetracycline. Talk with your health care provider before your test if you take one of these medicines. What do the results mean? Abnormal test results may indicate a number of health conditions. These may include:  Cancer. Although pap test results cannot be used to diagnose cancer of the cervix, vagina, or uterus, they may suggest the possibility of cancer. Further tests would be required to determine if cancer is present.  Sexually transmitted  disease.  Fungal infection.  Parasite infection.  Herpes infection.  A condition causing or contributing to infertility. It is your responsibility to obtain your test results. Ask the lab or department performing the test when and how you will get your results. Contact your health care provider to discuss any questions you have about your results. Talk with your health care provider to discuss your results, treatment options, and if necessary, the need for more tests. Talk with your health care provider if you have any questions about your results. This information is not intended to replace advice given to you by your health care provider. Make sure you discuss any questions you have with your health care provider. Document Released: 07/05/2002 Document Revised: 12/19/2015 Document Reviewed: 09/05/2013 Elsevier Interactive Patient Education  2017 [employer].

## 2016-06-10 LAB — GC/CHLAMYDIA PROBE AMP
Chlamydia trachomatis, NAA: NEGATIVE
Neisseria gonorrhoeae by PCR: NEGATIVE

## 2016-06-12 ENCOUNTER — Telehealth: Payer: Self-pay | Admitting: Obstetrics and Gynecology

## 2016-06-12 NOTE — Telephone Encounter (Signed)
Patient is currently incarcerated , and message left with her nurse that pt will need a further appt to schedule cold knife conization of the cervix.

## 2016-06-16 ENCOUNTER — Telehealth: Payer: Self-pay | Admitting: *Deleted

## 2016-06-16 NOTE — Telephone Encounter (Signed)
Nurse called back. Will schedule preop appointment.

## 2016-06-30 ENCOUNTER — Encounter: Payer: Self-pay | Admitting: Obstetrics and Gynecology

## 2016-06-30 ENCOUNTER — Ambulatory Visit (INDEPENDENT_AMBULATORY_CARE_PROVIDER_SITE_OTHER): Payer: PRIVATE HEALTH INSURANCE | Admitting: Obstetrics and Gynecology

## 2016-06-30 DIAGNOSIS — D069 Carcinoma in situ of cervix, unspecified: Secondary | ICD-10-CM | POA: Diagnosis not present

## 2016-06-30 NOTE — Progress Notes (Signed)
Patient ID: [name], female   DOB: May 27, 1975, 41 y.o.   MRN: [id number]  Preoperative History and Physical  [name] is a 41 y.o. [phone] here for surgical management of CIN-III high grade squamous endocervical lesion. No significant preoperative concerns. Pt currently incarcerated Proposed surgery: cold knife conization   Past Medical History:  Diagnosis Date  . Brain tumor ([place])   . Cancer ([place])   . Chronic back pain   . Noncompliance with medication regimen   . Seizures ([place])    Past Surgical History:  Procedure Laterality Date  . ABDOMINAL HYSTERECTOMY    . TUBAL LIGATION     OB History  Gravida Para Term Preterm AB Living  3 3 3     3   SAB TAB Ectopic Multiple Live Births               # Outcome Date GA Lbr Len/2nd Weight Sex Delivery Anes PTL Lv  3 Term           2 Term           1 Term             Patient denies any other pertinent gynecologic issues.   Current Outpatient Prescriptions on File Prior to Visit  Medication Sig Dispense Refill  . levETIRAcetam (KEPPRA) 500 MG tablet Take 1 tablet (500 mg total) by mouth 2 (two) times daily. 60 tablet 0  . divalproex (DEPAKOTE) 500 MG DR tablet Take 1 tablet (500 mg total) by mouth 3 (three) times daily. (Patient not taking: Reported on 05/08/2016) 90 tablet 0  . divalproex (DEPAKOTE) 500 MG DR tablet Take 1,000 mg by mouth at bedtime.    . medroxyPROGESTERone (PROVERA) 10 MG tablet Take 1 tablet (10 mg total) by mouth daily. (Patient not taking: Reported on 05/08/2016) 10 tablet 0   No current facility-administered medications on file prior to visit.    Allergies  Allergen Reactions  . Penicillins Rash    Has patient had a PCN reaction causing immediate rash, facial/tongue/throat swelling, SOB or lightheadedness with hypotension: Yes Has patient had a PCN reaction causing severe rash involving mucus membranes or skin necrosis: No Has patient had a PCN reaction that required hospitalization No Has patient  had a PCN reaction occurring within the last 10 years: No  If all of the above answers are "NO", then may proceed with Cephalosporin use.     Social History:   reports that she has quit smoking. Her smoking use included Cigarettes. She has a 6.00 pack-year smoking history. She has never used smokeless tobacco. She reports that she uses drugs, including "Crack" cocaine and Marijuana. She reports that she does not drink alcohol.  History reviewed. No pertinent family history.  Review of Systems: Noncontributory  PHYSICAL EXAM: Blood pressure 128/82, pulse 76, height 5\' 6"  (1.676 m), weight 196 lb 9.6 oz (89.2 kg), last menstrual period 01/06/2015. General appearance - alert, well appearing, and in no distress Chest - clear to auscultation, no wheezes, rales or rhonchi, symmetric air entry Heart - normal rate and regular rhythm Abdomen - soft, nontender, nondistended, no masses or organomegaly Extremities - peripheral pulses normal, no pedal edema, no clubbing or cyanosis  Labs: No results found for this or any previous visit (from the past 336 hour(s)).  Imaging Studies: No results found.  Assessment: There are no active problems to display for this patient. 1. Conization explained with medexplainer   Plan: Patient will undergo surgical management  with cold knife conization .    Marland Kitchenmec 06/30/2016 1:58 PM   By signing my name below, I, Hansel Feinstein, attest that this documentation has been prepared under the direction and in the presence of Jonnie Kind, MD. Electronically Signed: Hansel Feinstein, ED Scribe. 06/30/16. 1:56 PM.  I personally performed the services described in this documentation, which was SCRIBED in my presence. The recorded information has been reviewed and considered accurate. It has been edited as necessary during review. Jonnie Kind, MD

## 2016-07-07 NOTE — Patient Instructions (Signed)
[name]  07/07/2016     @PREFPERIOPPHARMACY @   Your procedure is scheduled on  07/15/2016   Report to [facility] at  66  A.M.  Call this number if you have problems the morning of surgery:  [phone]   Remember:  Do not eat food or drink liquids after midnight.  Take these medicines the morning of surgery with A SIP OF WATER  Depakote, keppra.   Do not wear jewelry, make-up or nail polish.  Do not wear lotions, powders, or perfumes, or deoderant.  Do not shave 48 hours prior to surgery.  Men may shave face and neck.  Do not bring valuables to the hospital.  [facility] is not responsible for any belongings or valuables.  Contacts, dentures or bridgework may not be worn into surgery.  Leave your suitcase in the car.  After surgery it may be brought to your room.  For patients admitted to the hospital, discharge time will be determined by your treatment team.  Patients discharged the day of surgery will not be allowed to drive home.   Name and phone number of your driver:   family Special instructions:  None  Please read over the following fact sheets that you were given. Anesthesia Post-op Instructions and Care and Recovery After Surgery       Cervical Conization Cervical conization (cone biopsy) is a procedure in which a cone-shaped portion of the cervix is cut out so that it can be examined under a microscope. The procedure is done to check for cancer cells or cells that might turn into cancer (precancerous cells). You may have this procedure if:  You have abnormal bleeding from your cervix.  You had an abnormal Pap test.  Something abnormal was seen on your cervix during an exam. This procedure is performed in either a health care provider's office or in an operating room. Tell a health care provider about:  Any allergies you have.  All medicines you are taking, including vitamins, herbs, eye drops, creams, and over-the-counter  medicines.  Any problems you or family members have had with the use of anesthetic medicines.  Any blood disorders you have.  Any surgeries you have had.  Any medical conditions you have.  Your smoking habits.  When you normally have your period.  Whether you are pregnant or may be pregnant. What are the risks? Generally, this is a safe procedure. However, problems may occur, including:  Heavy bleeding for several days or weeks after the procedure.  Allergic reactions to medicines or dyes.  Increased risk of preterm labor in future pregnancies.  Infection (rare).  Damage to the cervix or other structures or organs (rare). What happens before the procedure? Staying hydrated  Follow instructions from your health care provider about hydration, which may include:  Up to 2 hours before the procedure - you may continue to drink clear liquids, such as water, clear fruit juice, black coffee, and plain tea. Eating and drinking restrictions  Follow instructions from your health care provider about eating and drinking, which may include:  8 hours before the procedure - stop eating heavy meals or foods such as meat, fried foods, or fatty foods.  6 hours before the procedure - stop eating light meals or foods, such as toast or cereal.  6 hours before the procedure - stop drinking milk or drinks that contain milk.  2 hours before the procedure - stop drinking clear  liquids. General instructions   Do not douche, have sex, use tampons, or use any vaginal medicines before the procedure as told by your health care provider.  You may be asked to empty your bladder and bowel right before the procedure.  Ask your health care provider about:  Changing or stopping your normal medicines. This is important if you take diabetes medicines or blood thinners.  Taking medicines such as aspirin and ibuprofen. These medicines can thin your blood. Do not take these medicines before your procedure  if your doctor tells you not to.  Plan to have someone take you home from the hospital or clinic. What happens during the procedure?  To reduce your risk of infection:  Your health care team will wash or sanitize their hands.  Your skin will be washed with soap.  Hair may be removed from the surgical area.  You will undress from the waist down and be given a gown to wear.  You will lie on an examining table and put your feet in stirrups.  An IV tube will be inserted into one of your veins.  You will be given one or more of the following:  A medicine to help you relax (sedative).  A medicine to numb the area (local anesthetic).  A medicine to make you fall asleep (general anesthetic).  A medicine that numbs the cervix (cervical block).  A lubricated device called a speculum will be inserted into your vagina. It will be used to spread open the walls of the vagina so your health care provider can see the inside of the vagina and cervix better.  An instrument that has a magnifying lens and a light (colposcope) will let your health care provider examine the cervix more closely.  Your health care provider will apply a solution to your cervix. This turns abnormal areas a pale color.  A tissue sample will be removed from the cervix using one of the following methods:  The cold knife method. In this method, the tissue is cut out with a knife (scalpel).  The loop electrosurgical excision procedure (LEEP) method. In this method, the tissue is cut out with a thin wire that can burn (cauterize) the tissue with an electrical current.  Laser treatment method. In this method, the tissue is cut out and then cauterized with a laser beam to prevent bleeding.  Your health care provider will apply a paste over the biopsy areas to help control bleeding.  The tissue sample will be examined under a microscope. The procedure may vary among health care providers and hospitals. What happens after  the procedure?  Your blood pressure, heart rate, breathing rate, and blood oxygen level will be monitored often until the medicines you were given have worn off.  If you were given a local anesthetic, you will rest at the clinic or hospital until you are stable and feel ready to go home.  If you were given a general anesthetic, you may be monitored for a longer period of time.  You may have some cramping.  You may have bloody discharge or light to moderate bleeding.  You may have dark discharge coming from your vagina. This is from the paste used on the cervix to prevent bleeding. Summary  Cervical conization is a procedure in which a cone-shaped portion of the cervix is cut out so that it can be examined under a microscope.  The procedure is done to check for cancer cells or cells that might turn into cancer (  precancerous cells). This information is not intended to replace advice given to you by your health care provider. Make sure you discuss any questions you have with your health care provider. Document Released: 01/22/2005 Document Revised: 04/16/2016 Document Reviewed: 04/16/2016 Elsevier Interactive Patient Education  2017 [employer] Cervical Conization, Care After This sheet gives you information about how to care for yourself after your procedure. Your health care provider may also give you more specific instructions. If you have problems or questions, contact your health care provider. What can I expect after the procedure? After the procedure, it is common to have:  A groggy feeling, if you were given medicine to make you fall asleep (general anesthetic).  Cramps that feel similar to menstrual cramps.  Bloody discharge or light to moderate bleeding.  Dark discharge. This discharge may look similar to coffee grounds. This is from the paste that was applied to the cervix to control bleeding. Follow these instructions at home: Medicines   Take over-the-counter and  prescription medicines only as told by your health care provider.  Do not take aspirin until your health care provider says it is okay. Aspirin can cause bleeding.  If you are taking pain medicine:  You may need to prevent or treat constipation. To do this, your health care provider may recommend that you:  Drink enough fluid to keep your urine clear or pale yellow.  Take over-the-counter or prescription medicines.  Eat foods that are high in fiber, such as fresh fruits and vegetables, whole grains, bran, and beans.  Limit foods that are high in fat and processed sugars, such as fried and sweet foods.  Do not drive or use heavy machinery. General instructions   You may resume your normal diet unless your health care provider advises you not to do so.  Take showers for the first week. Do not take baths, swim, or use hot tubs until your health care provider says it is okay.  Do not douche, use tampons, or have sex until your health care provider says it is okay.  Avoid activities that require great effort, such as exercises and heavy lifting, for at least 7-14 days.  Keep all follow-up visits as told by your health care provider. This is important. Contact a health care provider if:  You develop a rash.  You are dizzy or lightheaded.  You feel nauseous or you vomit.  You develop a bad smelling discharge from your vagina. Get help right away if:  You have blood clots or bleeding that is heavier than a normal period. Bleeding that soaks a pad in less than 1 hour is considered heavy bleeding.  You have a fever.  You have increasing cramps.  You faint.  You have pain when you urinate.  You have severe or worsening pain.  Your pain is not relieved when you take medicine.  You have bloody urine.  You vomit. Summary  After the procedure, it is common to have cramps and dark or bloody discharge from your vagina.  Do not douche, use tampons, or have sex until your  health care provider says it is okay.  Follow all other activity restrictions as told by your health care provider. This information is not intended to replace advice given to you by your health care provider. Make sure you discuss any questions you have with your health care provider. Document Released: 04/14/2005 Document Revised: 04/16/2016 Document Reviewed: 04/16/2016 Elsevier Interactive Patient Education  2017 [place] Anesthesia, Adult General  anesthesia is the use of medicines to make a person "go to sleep" (be unconscious) for a medical procedure. General anesthesia is often recommended when a procedure:  Is long.  Requires you to be still or in an unusual position.  Is major and can cause you to lose blood.  Is impossible to do without general anesthesia. The medicines used for general anesthesia are called general anesthetics. In addition to making you sleep, the medicines:  Prevent pain.  Control your blood pressure.  Relax your muscles. Tell a health care provider about:  Any allergies you have.  All medicines you are taking, including vitamins, herbs, eye drops, creams, and over-the-counter medicines.  Any problems you or family members have had with anesthetic medicines.  Types of anesthetics you have had in the past.  Any bleeding disorders you have.  Any surgeries you have had.  Any medical conditions you have.  Any history of heart or lung conditions, such as heart failure, sleep apnea, or chronic obstructive pulmonary disease (COPD).  Whether you are pregnant or may be pregnant.  Whether you use tobacco, alcohol, marijuana, or street drugs.  Any history of Armed forces logistics/support/administrative officer.  Any history of depression or anxiety. What are the risks? Generally, this is a safe procedure. However, problems may occur, including:  Allergic reaction to anesthetics.  Lung and heart problems.  Inhaling food or liquids from your stomach into your lungs  (aspiration).  Injury to nerves.  Waking up during your procedure and being unable to move (rare).  Extreme agitation or a state of mental confusion (delirium) when you wake up from the anesthetic.  Air in the bloodstream, which can lead to stroke. These problems are more likely to develop if you are having a major surgery or if you have an advanced medical condition. You can prevent some of these complications by answering all of your health care provider's questions thoroughly and by following all pre-procedure instructions. General anesthesia can cause side effects, including:  Nausea or vomiting  A sore throat from the breathing tube.  Feeling cold or shivery.  Feeling tired, washed out, or achy.  Sleepiness or drowsiness.  Confusion or agitation. What happens before the procedure? Staying hydrated  Follow instructions from your health care provider about hydration, which may include:  Up to 2 hours before the procedure - you may continue to drink clear liquids, such as water, clear fruit juice, black coffee, and plain tea. Eating and drinking restrictions  Follow instructions from your health care provider about eating and drinking, which may include:  8 hours before the procedure - stop eating heavy meals or foods such as meat, fried foods, or fatty foods.  6 hours before the procedure - stop eating light meals or foods, such as toast or cereal.  6 hours before the procedure - stop drinking milk or drinks that contain milk.  2 hours before the procedure - stop drinking clear liquids. Medicines   Ask your health care provider about:  Changing or stopping your regular medicines. This is especially important if you are taking diabetes medicines or blood thinners.  Taking medicines such as aspirin and ibuprofen. These medicines can thin your blood. Do not take these medicines before your procedure if your health care provider instructs you not to.  Taking new dietary  supplements or medicines. Do not take these during the week before your procedure unless your health care provider approves them.  If you are told to take a medicine or to continue  taking a medicine on the day of the procedure, take the medicine with sips of water. General instructions    Ask if you will be going home the same day, the following day, or after a longer hospital stay.  Plan to have someone take you home.  Plan to have someone stay with you for the first 24 hours after you leave the hospital or clinic.  For 3-6 weeks before the procedure, try not to use any tobacco products, such as cigarettes, chewing tobacco, and e-cigarettes.  You may brush your teeth on the morning of the procedure, but make sure to spit out the toothpaste. What happens during the procedure?  You will be given anesthetics through a mask and through an IV tube in one of your veins.  You may receive medicine to help you relax (sedative).  As soon as you are asleep, a breathing tube may be used to help you breathe.  An anesthesia specialist will stay with you throughout the procedure. He or she will help keep you comfortable and safe by continuing to give you medicines and adjusting the amount of medicine that you get. He or she will also watch your blood pressure, pulse, and oxygen levels to make sure that the anesthetics do not cause any problems.  If a breathing tube was used to help you breathe, it will be removed before you wake up. The procedure may vary among health care providers and hospitals. What happens after the procedure?  You will wake up, often slowly, after the procedure is complete, usually in a recovery area.  Your blood pressure, heart rate, breathing rate, and blood oxygen level will be monitored until the medicines you were given have worn off.  You may be given medicine to help you calm down if you feel anxious or agitated.  If you will be going home the same day, your health  care provider may check to make sure you can stand, drink, and urinate.  Your health care providers will treat your pain and side effects before you go home.  Do not drive for 24 hours if you received a sedative.  You may:  Feel nauseous and vomit.  Have a sore throat.  Have mental slowness.  Feel cold or shivery.  Feel sleepy.  Feel tired.  Feel sore or achy, even in parts of your body where you did not have surgery. This information is not intended to replace advice given to you by your health care provider. Make sure you discuss any questions you have with your health care provider. Document Released: 07/22/2007 Document Revised: 09/25/2015 Document Reviewed: 03/29/2015 Elsevier Interactive Patient Education  2017 [place] Anesthesia, Adult, Care After These instructions provide you with information about caring for yourself after your procedure. Your health care provider may also give you more specific instructions. Your treatment has been planned according to current medical practices, but problems sometimes occur. Call your health care provider if you have any problems or questions after your procedure. What can I expect after the procedure? After the procedure, it is common to have:  Vomiting.  A sore throat.  Mental slowness. It is common to feel:  Nauseous.  Cold or shivery.  Sleepy.  Tired.  Sore or achy, even in parts of your body where you did not have surgery. Follow these instructions at home: For at least 24 hours after the procedure:   Do not:  Participate in activities where you could fall or become injured.  Drive.  Use heavy machinery.  Drink alcohol.  Take sleeping pills or medicines that cause drowsiness.  Make important decisions or sign legal documents.  Take care of children on your own.  Rest. Eating and drinking   If you vomit, drink water, juice, or soup when you can drink without vomiting.  Drink enough  fluid to keep your urine clear or pale yellow.  Make sure you have little or no nausea before eating solid foods.  Follow the diet recommended by your health care provider. General instructions   Have a responsible adult stay with you until you are awake and alert.  Return to your normal activities as told by your health care provider. Ask your health care provider what activities are safe for you.  Take over-the-counter and prescription medicines only as told by your health care provider.  If you smoke, do not smoke without supervision.  Keep all follow-up visits as told by your health care provider. This is important. Contact a health care provider if:  You continue to have nausea or vomiting at home, and medicines are not helpful.  You cannot drink fluids or start eating again.  You cannot urinate after 8-12 hours.  You develop a skin rash.  You have fever.  You have increasing redness at the site of your procedure. Get help right away if:  You have difficulty breathing.  You have chest pain.  You have unexpected bleeding.  You feel that you are having a life-threatening or urgent problem. This information is not intended to replace advice given to you by your health care provider. Make sure you discuss any questions you have with your health care provider. Document Released: 07/21/2000 Document Revised: 09/17/2015 Document Reviewed: 03/29/2015 Elsevier Interactive Patient Education  2017 [employer].

## 2016-07-10 ENCOUNTER — Encounter (HOSPITAL_COMMUNITY)
Admission: RE | Admit: 2016-07-10 | Discharge: 2016-07-10 | Disposition: A | Discharge status: Home / Self Care | Payer: Medicaid Other | Source: Ambulatory Visit | Attending: Obstetrics and Gynecology | Admitting: Obstetrics and Gynecology

## 2016-07-10 ENCOUNTER — Other Ambulatory Visit: Payer: Self-pay | Admitting: Obstetrics and Gynecology

## 2016-07-10 ENCOUNTER — Encounter (HOSPITAL_COMMUNITY): Payer: Self-pay

## 2016-07-10 DIAGNOSIS — D069 Carcinoma in situ of cervix, unspecified: Secondary | ICD-10-CM | POA: Insufficient documentation

## 2016-07-10 DIAGNOSIS — Z01812 Encounter for preprocedural laboratory examination: Secondary | ICD-10-CM | POA: Diagnosis present

## 2016-07-10 LAB — COMPREHENSIVE METABOLIC PANEL
ALBUMIN: 3.8 g/dL (ref 3.5–5.0)
ALK PHOS: 68 U/L (ref 38–126)
ALT: 30 U/L (ref 14–54)
AST: 22 U/L (ref 15–41)
Anion gap: 7 (ref 5–15)
BUN: 15 mg/dL (ref 6–20)
CALCIUM: 9.3 mg/dL (ref 8.9–10.3)
CO2: 27 mmol/L (ref 22–32)
CREATININE: 0.81 mg/dL (ref 0.44–1.00)
Chloride: 104 mmol/L (ref 101–111)
GFR calc Af Amer: >60 mL/min (ref >60)
GFR calc non Af Amer: >60 mL/min (ref >60)
GLUCOSE: 102 mg/dL — AB (ref 65–99)
Potassium: 4.6 mmol/L (ref 3.5–5.1)
SODIUM: 138 mmol/L (ref 135–145)
Total Bilirubin: 0.3 mg/dL (ref 0.3–1.2)
Total Protein: 6.8 g/dL (ref 6.5–8.1)

## 2016-07-10 LAB — CBC
HCT: 28.1 % — ABNORMAL LOW (ref 36.0–46.0)
Hemoglobin: 8.6 g/dL — ABNORMAL LOW (ref 12.0–15.0)
MCH: 22.2 pg — ABNORMAL LOW (ref 26.0–34.0)
MCHC: 30.6 g/dL (ref 30.0–36.0)
MCV: 72.4 fL — ABNORMAL LOW (ref 78.0–100.0)
Platelets: 215 10*3/uL (ref 150–400)
RBC: 3.88 MIL/uL (ref 3.87–5.11)
RDW: 18.8 % — ABNORMAL HIGH (ref 11.5–15.5)
WBC: 5.2 10*3/uL (ref 4.0–10.5)

## 2016-07-10 LAB — HCG, SERUM, QUALITATIVE: Preg, Serum: NEGATIVE

## 2016-07-11 NOTE — Progress Notes (Signed)
Dr Glo Herring and Dr Patsey Berthold notified of HGB 8.6.  Dr Glo Herring rescheduling patient in 2 weeks.  [facility] to set up  [facility] with [facility]  Dr Patsey Berthold ordered ISTAT and Type and Screen on arrival day of surgery. Rosalyn Gess RN, BSN, CNOR

## 2016-07-15 ENCOUNTER — Encounter (HOSPITAL_COMMUNITY)
Admission: RE | Admit: 2016-07-15 | Discharge: 2016-07-15 | Disposition: A | Discharge status: Home / Self Care | Source: Ambulatory Visit | Attending: Obstetrics and Gynecology | Admitting: Obstetrics and Gynecology

## 2016-07-15 ENCOUNTER — Encounter (HOSPITAL_COMMUNITY): Payer: Self-pay

## 2016-07-15 ENCOUNTER — Other Ambulatory Visit: Payer: Self-pay | Admitting: Obstetrics and Gynecology

## 2016-07-15 DIAGNOSIS — N920 Excessive and frequent menstruation with regular cycle: Secondary | ICD-10-CM | POA: Insufficient documentation

## 2016-07-15 DIAGNOSIS — D649 Anemia, unspecified: Secondary | ICD-10-CM | POA: Insufficient documentation

## 2016-07-15 MED ORDER — SODIUM CHLORIDE 0.9 % IV SOLN: 510.0000 mg | Freq: Once | INTRAVENOUS | Status: DC

## 2016-07-15 MED ORDER — SODIUM CHLORIDE 0.9 % IV SOLN
INTRAVENOUS | Status: DC
  Administered 2016-07-15: 250 mL via INTRAVENOUS

## 2016-07-15 MED ORDER — SODIUM CHLORIDE 0.9 % IV SOLN
510.0000 mg | Freq: Once | INTRAVENOUS | Status: AC
  Administered 2016-07-15: 510 mg via INTRAVENOUS
  Filled 2016-07-15: qty 17

## 2016-07-15 NOTE — Discharge Instructions (Signed)

## 2016-07-15 NOTE — Progress Notes (Signed)
Good blood return noted in peripheral IV before the start of Feraheme.

## 2016-07-15 NOTE — Progress Notes (Signed)
Blood return noted at end of Feraheme infusion.  Instructions given to Officer for surgery date and iron medication.  No complaints voiced.

## 2016-07-24 ENCOUNTER — Encounter: Payer: PRIVATE HEALTH INSURANCE | Admitting: Obstetrics and Gynecology

## 2016-07-24 ENCOUNTER — Other Ambulatory Visit: Payer: Self-pay | Admitting: Obstetrics and Gynecology

## 2016-07-24 ENCOUNTER — Encounter (HOSPITAL_COMMUNITY): Payer: Self-pay

## 2016-07-24 ENCOUNTER — Ambulatory Visit (INDEPENDENT_AMBULATORY_CARE_PROVIDER_SITE_OTHER): Payer: Self-pay | Admitting: Obstetrics and Gynecology

## 2016-07-24 ENCOUNTER — Encounter: Payer: Self-pay | Admitting: Obstetrics and Gynecology

## 2016-07-24 ENCOUNTER — Encounter (HOSPITAL_COMMUNITY)
Admission: RE | Admit: 2016-07-24 | Discharge: 2016-07-24 | Disposition: A | Discharge status: Home / Self Care | Source: Ambulatory Visit | Attending: Obstetrics and Gynecology | Admitting: Obstetrics and Gynecology

## 2016-07-24 ENCOUNTER — Ambulatory Visit (INDEPENDENT_AMBULATORY_CARE_PROVIDER_SITE_OTHER): Payer: Self-pay

## 2016-07-24 VITALS — BP 100/64 | HR 96 | Ht 66.0 in | Wt 195.0 lb

## 2016-07-24 DIAGNOSIS — N939 Abnormal uterine and vaginal bleeding, unspecified: Secondary | ICD-10-CM

## 2016-07-24 DIAGNOSIS — D069 Carcinoma in situ of cervix, unspecified: Secondary | ICD-10-CM

## 2016-07-24 DIAGNOSIS — Z01818 Encounter for other preprocedural examination: Secondary | ICD-10-CM

## 2016-07-24 NOTE — Pre-Procedure Instructions (Signed)
Patient in to dee Dr Glo Herring this afternoon. He states he will draw CBC and HCG in office today. He does not want a type and screen the morning of surgery. The previous blood work orders were D/C'd by me per his order, Will use the labs he draws today for surgery.

## 2016-07-24 NOTE — Progress Notes (Signed)
Preoperative History and Physical  [name] is a 41 y.o. 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   No significant preoperative concerns. Patient is currently incarcerated.  Surgery was previously scheduled and cancelled due to the anemia , and pt received feraheme  additionally she complains of continued irregular menstrual bleeding. The surgery was postponed due to recent anemia. We will perform an ultrasound today to check out the endometrium. Because the cervix is stenotic  An endometrial biopsy cannot be done at this time. We will therefore add dilation and curettage to planned surgery on Tuesday Serum hCG and CBC are ordered today Proposed surgery: cold knife conization on cervix                                  Hysteroscopy,Dilation and curettage  Past Medical History:  Diagnosis Date  . Brain tumor ([place])   . Cancer ([place])   . Chronic back pain   . Noncompliance with medication regimen   . Seizures ([place])    Past Surgical History:  Procedure Laterality Date  . ABDOMINAL HYSTERECTOMY    . TUBAL LIGATION     OB History  Gravida Para Term Preterm AB Living  3 3 3     3   SAB TAB Ectopic Multiple Live Births               # Outcome Date GA Lbr Len/2nd Weight Sex Delivery Anes PTL Lv  3 Term           2 Term           1 Term             Patient denies any other pertinent gynecologic issues.   Current Outpatient Prescriptions on File Prior to Visit  Medication Sig Dispense Refill  . levETIRAcetam (KEPPRA) 500 MG tablet Take 1 tablet (500 mg total) by mouth 2 (two) times daily. 60 tablet 0   Current Facility-Administered Medications on File Prior to Visit  Medication Dose Route Frequency Provider Last Rate Last Dose  . ferumoxytol (FERAHEME) 510 mg in sodium chloride 0.9 % 100 mL IVPB  510 mg Intravenous Once Jonnie Kind, MD       Allergies  Allergen Reactions  . Penicillins Rash    Has patient had a PCN reaction  causing immediate rash, facial/tongue/throat swelling, SOB or lightheadedness with hypotension: Yes Has patient had a PCN reaction causing severe rash involving mucus membranes or skin necrosis: No Has patient had a PCN reaction that required hospitalization No Has patient had a PCN reaction occurring within the last 10 years: No  If all of the above answers are "NO", then may proceed with Cephalosporin use.     Social History:   reports that she has quit smoking. Her smoking use included Cigarettes. She has a 6.00 pack-year smoking history. She has never used smokeless tobacco. She reports that she does not drink alcohol or use drugs.  Family History  Problem Relation Age of Onset  . Other Father     shot  . Cancer Mother     Review of Systems: Noncontributory  PHYSICAL EXAM: Blood pressure 100/64, pulse 96, height 5\' 6"  (1.676 m), weight 195 lb (88.5 kg), last menstrual period 01/06/2015. General appearance - alert, well appearing, and in no distress Chest - clear to auscultation, no wheezes, rales or rhonchi, symmetric air entry  Heart - normal rate and regular rhythm Abdomen - soft, nontender, nondistended, no masses or organomegaly                     Pelvic - examination normal, cervix is normal, uterus anterior Extremities - peripheral pulses normal, no pedal edema, no clubbing or cyanosis  Labs: Results for orders placed or performed during the hospital encounter of 07/10/16 (from the past 336 hour(s))  CBC   Collection Time: 07/10/16  2:44 PM  Result Value Ref Range   WBC 5.2 4.0 - 10.5 K/uL   RBC 3.88 3.87 - 5.11 MIL/uL   Hemoglobin 8.6 (L) 12.0 - 15.0 g/dL   HCT 28.1 (L) 36.0 - 46.0 %   MCV 72.4 (L) 78.0 - 100.0 fL   MCH 22.2 (L) 26.0 - 34.0 pg   MCHC 30.6 30.0 - 36.0 g/dL   RDW 18.8 (H) 11.5 - 15.5 %   Platelets 215 150 - 400 K/uL  Comprehensive metabolic panel   Collection Time: 07/10/16  2:44 PM  Result Value Ref Range   Sodium 138 135 - 145 mmol/L    Potassium 4.6 3.5 - 5.1 mmol/L   Chloride 104 101 - 111 mmol/L   CO2 27 22 - 32 mmol/L   Glucose, Bld 102 (H) 65 - 99 mg/dL   BUN 15 6 - 20 mg/dL   Creatinine, Ser 0.81 0.44 - 1.00 mg/dL   Calcium 9.3 8.9 - 10.3 mg/dL   Total Protein 6.8 6.5 - 8.1 g/dL   Albumin 3.8 3.5 - 5.0 g/dL   AST 22 15 - 41 U/L   ALT 30 14 - 54 U/L   Alkaline Phosphatase 68 38 - 126 U/L   Total Bilirubin 0.3 0.3 - 1.2 mg/dL   GFR calc non Af Amer >60 >60 mL/min   GFR calc Af Amer >60 >60 mL/min   Anion gap 7 5 - 15  hCG, serum, qualitative   Collection Time: 07/10/16  2:44 PM  Result Value Ref Range   Preg, Serum NEGATIVE NEGATIVE    Imaging Studies: No results found.ltrasound ordered today. Moderate thickened of endometrium [name] is a 41 y.o. [id number] LMP  06/25/2016 for a pelvic sonogram for abnormal uterine bleeding .  Uterus                      8.1 x 5.3 x 5.6 cm,  homogeneous anteverted uterus,wnl  Endometrium          18.3 mm, symmetrical, thickened endometrium  Right ovary             2.6 x 1.2 x 1.8 cm, wnl  Left ovary                2.8 x 1.8 x 2.1 cm, simple dominate left follicle 2.1 x 1.4 x 1.9 cm  No free fluid   Technician Comments:  PELVIC US TA/TV: homogeneous anteverted uterus,wnl,thickened endometrium 18.3 mm,normal ovaries bilat,simple dominate left follicle 2.1 x 1.4 x 1.9 cm,no free fluid,left adnexal pain during ultrasound,ovaries appear mobile    [employer] 07/24/2016 4:05 PM  Assessment: Patient Active Problem List   Diagnosis Date Noted  . CIN III (cervical intraepithelial neoplasia grade III) with severe dysplasia 06/30/2016  2.Dysfunctional uterine bleeding  Plan: Patient will undergo surgical management with cold knife conization of cervix. CBC, quant serum pregnancy test, and [place] today. Will add hysteroscopy, D&C to surgical procedure.    07/24/2016 2:19  PM

## 2016-07-24 NOTE — Progress Notes (Signed)
Patient ID: [name], female   DOB: 09-23-1975, 41 y.o.   MRN: [id number]  Preoperative History and Physical  [name] is a 41 y.o. [phone] here for surgical management of CIN-III high grade squamous endocervical lesion. No significant preoperative concerns. Pt currently incarcerated Proposed surgery: cold knife conization       Past Medical History:  Diagnosis Date  . Brain tumor ([place])   . Cancer ([place])   . Chronic back pain   . Noncompliance with medication regimen   . Seizures ([place])         Past Surgical History:  Procedure Laterality Date  . ABDOMINAL HYSTERECTOMY    . TUBAL LIGATION                     OB History  Gravida Para Term Preterm AB Living  3 3 3     3   SAB TAB Ectopic Multiple Live Births               # Outcome Date GA Lbr Len/2nd Weight Sex Delivery Anes PTL Lv  3 Term           2 Term           1 Term             Patient denies any other pertinent gynecologic issues.         Current Outpatient Prescriptions on File Prior to Visit  Medication Sig Dispense Refill  . levETIRAcetam (KEPPRA) 500 MG tablet Take 1 tablet (500 mg total) by mouth 2 (two) times daily. 60 tablet 0  . divalproex (DEPAKOTE) 500 MG DR tablet Take 1 tablet (500 mg total) by mouth 3 (three) times daily. (Patient not taking: Reported on 05/08/2016) 90 tablet 0  . divalproex (DEPAKOTE) 500 MG DR tablet Take 1,000 mg by mouth at bedtime.    . medroxyPROGESTERone (PROVERA) 10 MG tablet Take 1 tablet (10 mg total) by mouth daily. (Patient not taking: Reported on 05/08/2016) 10 tablet 0   No current facility-administered medications on file prior to visit.         Allergies  Allergen Reactions  . Penicillins Rash    Has patient had a PCN reaction causing immediate rash, facial/tongue/throat swelling, SOB or lightheadedness with hypotension: Yes Has patient had a PCN reaction causing severe rash involving mucus membranes or skin  necrosis: No Has patient had a PCN reaction that required hospitalization No Has patient had a PCN reaction occurring within the last 10 years: No  If all of the above answers are "NO", then may proceed with Cephalosporin use.     Social History:   reports that she has quit smoking. Her smoking use included Cigarettes. She has a 6.00 pack-year smoking history. She has never used smokeless tobacco. She reports that she uses drugs, including "Crack" cocaine and Marijuana. She reports that she does not drink alcohol.  History reviewed. No pertinent family history.  Review of Systems: Noncontributory  PHYSICAL EXAM: Blood pressure 128/82, pulse 76, height 5\' 6"  (1.676 m), weight 196 lb 9.6 oz (89.2 kg), last menstrual period 01/06/2015. General appearance - alert, well appearing, and in no distress Chest - clear to auscultation, no wheezes, rales or rhonchi, symmetric air entry Heart - normal rate and regular rhythm Abdomen - soft, nontender, nondistended, no masses or organomegaly Extremities - peripheral pulses normal, no pedal edema, no clubbing or cyanosis  Labs: CBC Latest Ref Rng & Units 07/10/2016 04/08/2016 10/29/2015  WBC 4.0 - 10.5 K/uL 5.2 7.3 5.9  Hemoglobin 12.0 - 15.0 g/dL 8.6(L) 9.9(L) 12.9  Hematocrit 36.0 - 46.0 % 28.1(L) 30.8(L) 38.3  Platelets 150 - 400 K/uL 215 232 121(L)     Imaging Studies: ImagingResults  No results found.    Assessment: There are no active problems to display for this patient. 1. Conization explained with medexplainer   Plan: Patient will undergo surgical management with cold knife conization .    Marland Kitchenmec 06/30/2016 1:58 PM

## 2016-07-24 NOTE — Progress Notes (Signed)
PELVIC US TA/TV: homogeneous anteverted uterus,wnl,thickened endometrium 18.3 mm,normal ovaries bilat,simple dominate left follicle 2.1 x 1.4 x 1.9 cm,no free fluid,left adnexal pain during ultrasound,ovaries appear mobile

## 2016-07-24 NOTE — Progress Notes (Addendum)
Preoperative History and Physical  [name] is a 41 y.o. 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   No significant preoperative concerns. Patient is currently incarcerated.   additionally she complains of continued irregular menstrual bleeding. The surgery was postponed due to recent anemia. We will perform an ultrasound today to check out the endometrium. Because the cervix is stenotic  An endometrial biopsy cannot be done at this time. We will therefore add dilation and curettage to planned surgery on Tuesday Serum hCG and CBC are ordered today Proposed surgery: cold knife conization on cervix                                  Hysteroscopy,Dilation and curettage  Past Medical History:  Diagnosis Date   Brain tumor ([place])    Cancer ([place])    Chronic back pain    Noncompliance with medication regimen    Seizures ([place])    Past Surgical History:  Procedure Laterality Date   ABDOMINAL HYSTERECTOMY     TUBAL LIGATION     OB History  Gravida Para Term Preterm AB Living  3 3 3     3   SAB TAB Ectopic Multiple Live Births               # Outcome Date GA Lbr Len/2nd Weight Sex Delivery Anes PTL Lv  3 Term           2 Term           1 Term             Patient denies any other pertinent gynecologic issues.   Current Outpatient Prescriptions on File Prior to Visit  Medication Sig Dispense Refill   levETIRAcetam (KEPPRA) 500 MG tablet Take 1 tablet (500 mg total) by mouth 2 (two) times daily. 60 tablet 0   Current Facility-Administered Medications on File Prior to Visit  Medication Dose Route Frequency Provider Last Rate Last Dose   ferumoxytol (FERAHEME) 510 mg in sodium chloride 0.9 % 100 mL IVPB  510 mg Intravenous Once Jonnie Kind, MD       Allergies  Allergen Reactions   Penicillins Rash    Has patient had a PCN reaction causing immediate rash, facial/tongue/throat swelling, SOB or lightheadedness with hypotension:  Yes Has patient had a PCN reaction causing severe rash involving mucus membranes or skin necrosis: No Has patient had a PCN reaction that required hospitalization No Has patient had a PCN reaction occurring within the last 10 years: No  If all of the above answers are "NO", then may proceed with Cephalosporin use.     Social History:   reports that she has quit smoking. Her smoking use included Cigarettes. She has a 6.00 pack-year smoking history. She has never used smokeless tobacco. She reports that she does not drink alcohol or use drugs.  Family History  Problem Relation Age of Onset   Other Father     shot   Cancer Mother     Review of Systems: Noncontributory  PHYSICAL EXAM: Blood pressure 100/64, pulse 96, height 5\' 6"  (1.676 m), weight 195 lb (88.5 kg), last menstrual period 01/06/2015. General appearance - alert, well appearing, and in no distress Chest - clear to auscultation, no wheezes, rales or rhonchi, symmetric air entry Heart - normal rate and regular rhythm Abdomen - soft, nontender, nondistended, no masses or  organomegaly                     Pelvic - examination normal, cervix is normal, uterus anterior Extremities - peripheral pulses normal, no pedal edema, no clubbing or cyanosis  Labs: Results for orders placed or performed during the hospital encounter of 07/10/16 (from the past 336 hour(s))  CBC   Collection Time: 07/10/16  2:44 PM  Result Value Ref Range   WBC 5.2 4.0 - 10.5 K/uL   RBC 3.88 3.87 - 5.11 MIL/uL   Hemoglobin 8.6 (L) 12.0 - 15.0 g/dL   HCT 28.1 (L) 36.0 - 46.0 %   MCV 72.4 (L) 78.0 - 100.0 fL   MCH 22.2 (L) 26.0 - 34.0 pg   MCHC 30.6 30.0 - 36.0 g/dL   RDW 18.8 (H) 11.5 - 15.5 %   Platelets 215 150 - 400 K/uL  Comprehensive metabolic panel   Collection Time: 07/10/16  2:44 PM  Result Value Ref Range   Sodium 138 135 - 145 mmol/L   Potassium 4.6 3.5 - 5.1 mmol/L   Chloride 104 101 - 111 mmol/L   CO2 27 22 - 32 mmol/L   Glucose,  Bld 102 (H) 65 - 99 mg/dL   BUN 15 6 - 20 mg/dL   Creatinine, Ser 0.81 0.44 - 1.00 mg/dL   Calcium 9.3 8.9 - 10.3 mg/dL   Total Protein 6.8 6.5 - 8.1 g/dL   Albumin 3.8 3.5 - 5.0 g/dL   AST 22 15 - 41 U/L   ALT 30 14 - 54 U/L   Alkaline Phosphatase 68 38 - 126 U/L   Total Bilirubin 0.3 0.3 - 1.2 mg/dL   GFR calc non Af Amer >60 >60 mL/min   GFR calc Af Amer >60 >60 mL/min   Anion gap 7 5 - 15  hCG, serum, qualitative   Collection Time: 07/10/16  2:44 PM  Result Value Ref Range   Preg, Serum NEGATIVE NEGATIVE    Imaging Studies: No results found.ltrasound ordered today  Assessment: Patient Active Problem List   Diagnosis Date Noted   CIN III (cervical intraepithelial neoplasia grade III) with severe dysplasia 06/30/2016  2.Dysfunctional uterine bleeding  Plan: Patient will undergo surgical management with cold knife conization of cervix. CBC, quant serum pregnancy test, and [place] today. Will add hysteroscopy, D&C to surgical procedure.    07/24/2016 2:19 PM

## 2016-07-25 LAB — CBC
Hematocrit: 33.2 % — ABNORMAL LOW (ref 34.0–46.6)
Hemoglobin: 10.2 g/dL — ABNORMAL LOW (ref 11.1–15.9)
MCH: 23.6 pg — AB (ref 26.6–33.0)
MCHC: 30.7 g/dL — AB (ref 31.5–35.7)
MCV: 77 fL — ABNORMAL LOW (ref 79–97)
PLATELETS: 191 10*3/uL (ref 150–379)
RBC: 4.33 x10E6/uL (ref 3.77–5.28)
RDW: 24.8 % — AB (ref 12.3–15.4)
WBC: 5.6 10*3/uL (ref 3.4–10.8)

## 2016-07-25 LAB — BETA HCG QUANT (REF LAB)

## 2016-07-26 LAB — GC/CHLAMYDIA PROBE AMP
Chlamydia trachomatis, NAA: NEGATIVE
Neisseria gonorrhoeae by PCR: NEGATIVE

## 2016-08-05 ENCOUNTER — Ambulatory Visit (HOSPITAL_COMMUNITY)
Admission: RE | Admit: 2016-08-05 | Discharge: 2016-08-05 | Disposition: A | Discharge status: Home / Self Care | Source: Ambulatory Visit | Attending: Obstetrics and Gynecology | Admitting: Obstetrics and Gynecology

## 2016-08-05 ENCOUNTER — Encounter (HOSPITAL_COMMUNITY)
Admission: RE | Disposition: A | Discharge status: Home / Self Care | Payer: Self-pay | Source: Ambulatory Visit | Attending: Obstetrics and Gynecology

## 2016-08-05 ENCOUNTER — Ambulatory Visit (HOSPITAL_COMMUNITY): Admitting: Anesthesiology

## 2016-08-05 ENCOUNTER — Encounter (HOSPITAL_COMMUNITY): Payer: Self-pay | Admitting: *Deleted

## 2016-08-05 DIAGNOSIS — R569 Unspecified convulsions: Secondary | ICD-10-CM | POA: Insufficient documentation

## 2016-08-05 DIAGNOSIS — Z87891 Personal history of nicotine dependence: Secondary | ICD-10-CM | POA: Diagnosis not present

## 2016-08-05 DIAGNOSIS — Z8669 Personal history of other diseases of the nervous system and sense organs: Secondary | ICD-10-CM | POA: Insufficient documentation

## 2016-08-05 DIAGNOSIS — Z9114 Patient's other noncompliance with medication regimen: Secondary | ICD-10-CM | POA: Insufficient documentation

## 2016-08-05 DIAGNOSIS — Z809 Family history of malignant neoplasm, unspecified: Secondary | ICD-10-CM | POA: Insufficient documentation

## 2016-08-05 DIAGNOSIS — N938 Other specified abnormal uterine and vaginal bleeding: Secondary | ICD-10-CM | POA: Diagnosis not present

## 2016-08-05 DIAGNOSIS — Z79899 Other long term (current) drug therapy: Secondary | ICD-10-CM | POA: Insufficient documentation

## 2016-08-05 DIAGNOSIS — Z859 Personal history of malignant neoplasm, unspecified: Secondary | ICD-10-CM | POA: Insufficient documentation

## 2016-08-05 DIAGNOSIS — D067 Carcinoma in situ of other parts of cervix: Secondary | ICD-10-CM

## 2016-08-05 DIAGNOSIS — Z90710 Acquired absence of both cervix and uterus: Secondary | ICD-10-CM | POA: Diagnosis not present

## 2016-08-05 DIAGNOSIS — Z880 Allergy status to penicillin: Secondary | ICD-10-CM | POA: Diagnosis not present

## 2016-08-05 DIAGNOSIS — G8929 Other chronic pain: Secondary | ICD-10-CM | POA: Diagnosis not present

## 2016-08-05 DIAGNOSIS — R87613 High grade squamous intraepithelial lesion on cytologic smear of cervix (HGSIL): Secondary | ICD-10-CM | POA: Insufficient documentation

## 2016-08-05 DIAGNOSIS — M549 Dorsalgia, unspecified: Secondary | ICD-10-CM | POA: Diagnosis not present

## 2016-08-05 HISTORY — PX: HYSTEROSCOPY WITH D & C: SHX1775

## 2016-08-05 HISTORY — PX: CERVICAL CONIZATION W/BX: SHX1330

## 2016-08-05 SURGERY — CONE BIOPSY, CERVIX
Anesthesia: General

## 2016-08-05 MED ORDER — BUPIVACAINE-EPINEPHRINE (PF) 0.5% -1:200000 IJ SOLN
INTRAMUSCULAR | Status: AC
  Filled 2016-08-05: qty 30

## 2016-08-05 MED ORDER — SODIUM CHLORIDE 0.9 % IJ SOLN
INTRAMUSCULAR | Status: AC
  Filled 2016-08-05: qty 10

## 2016-08-05 MED ORDER — ONDANSETRON HCL 4 MG/2ML IJ SOLN
INTRAMUSCULAR | Status: AC
  Filled 2016-08-05: qty 2

## 2016-08-05 MED ORDER — CIPROFLOXACIN IN D5W 400 MG/200ML IV SOLN
INTRAVENOUS | Status: AC
  Filled 2016-08-05: qty 200

## 2016-08-05 MED ORDER — ROCURONIUM BROMIDE 50 MG/5ML IV SOLN
INTRAVENOUS | Status: AC
  Filled 2016-08-05: qty 1

## 2016-08-05 MED ORDER — BUPIVACAINE-EPINEPHRINE 0.5% -1:200000 IJ SOLN
INTRAMUSCULAR | Status: DC | PRN
  Administered 2016-08-05: 10 mL

## 2016-08-05 MED ORDER — FENTANYL CITRATE (PF) 100 MCG/2ML IJ SOLN
INTRAMUSCULAR | Status: DC | PRN
  Administered 2016-08-05 (×2): 50 ug via INTRAVENOUS

## 2016-08-05 MED ORDER — MIDAZOLAM HCL 2 MG/2ML IJ SOLN
INTRAMUSCULAR | Status: AC
  Filled 2016-08-05: qty 2

## 2016-08-05 MED ORDER — FERRIC SUBSULFATE 259 MG/GM EX SOLN
CUTANEOUS | Status: AC
  Filled 2016-08-05: qty 8

## 2016-08-05 MED ORDER — HEMOSTATIC AGENTS (NO CHARGE) OPTIME
TOPICAL | Status: DC | PRN
  Administered 2016-08-05: 1 via TOPICAL

## 2016-08-05 MED ORDER — PROPOFOL 10 MG/ML IV BOLUS
INTRAVENOUS | Status: AC
  Filled 2016-08-05: qty 20

## 2016-08-05 MED ORDER — SODIUM CHLORIDE 0.9 % IR SOLN
Status: DC | PRN
  Administered 2016-08-05: 3000 mL

## 2016-08-05 MED ORDER — MIDAZOLAM HCL 5 MG/5ML IJ SOLN
INTRAMUSCULAR | Status: DC | PRN
  Administered 2016-08-05: 2 mg via INTRAVENOUS

## 2016-08-05 MED ORDER — FENTANYL CITRATE (PF) 100 MCG/2ML IJ SOLN
25.0000 ug | INTRAMUSCULAR | Status: AC
  Administered 2016-08-05 (×2): 25 ug via INTRAVENOUS

## 2016-08-05 MED ORDER — IODINE STRONG (LUGOLS) 5 % PO SOLN
ORAL | Status: DC | PRN
  Administered 2016-08-05: 0.2 mL

## 2016-08-05 MED ORDER — EPHEDRINE SULFATE 50 MG/ML IJ SOLN
INTRAMUSCULAR | Status: AC
  Filled 2016-08-05: qty 1

## 2016-08-05 MED ORDER — FENTANYL CITRATE (PF) 100 MCG/2ML IJ SOLN: 25.0000 ug | INTRAMUSCULAR | Status: DC | PRN

## 2016-08-05 MED ORDER — MIDAZOLAM HCL 2 MG/2ML IJ SOLN
1.0000 mg | INTRAMUSCULAR | Status: AC
  Administered 2016-08-05: 2 mg via INTRAVENOUS

## 2016-08-05 MED ORDER — IBUPROFEN 600 MG PO TABS: 600.0000 mg | ORAL_TABLET | Freq: Four times a day (QID) | ORAL | 1 refills | Status: DC | PRN

## 2016-08-05 MED ORDER — ONDANSETRON HCL 4 MG/2ML IJ SOLN
4.0000 mg | Freq: Once | INTRAMUSCULAR | Status: AC
  Administered 2016-08-05: 4 mg via INTRAVENOUS

## 2016-08-05 MED ORDER — IODINE STRONG (LUGOLS) 5 % PO SOLN
ORAL | Status: AC
  Filled 2016-08-05: qty 1

## 2016-08-05 MED ORDER — ACETAMINOPHEN-CODEINE #3 300-30 MG PO TABS: 2.0000 | ORAL_TABLET | ORAL | 0 refills | Status: DC | PRN

## 2016-08-05 MED ORDER — FENTANYL CITRATE (PF) 100 MCG/2ML IJ SOLN
INTRAMUSCULAR | Status: AC
  Filled 2016-08-05: qty 2

## 2016-08-05 MED ORDER — 0.9 % SODIUM CHLORIDE (POUR BTL) OPTIME
TOPICAL | Status: DC | PRN
  Administered 2016-08-05: 1000 mL

## 2016-08-05 MED ORDER — PROPOFOL 10 MG/ML IV BOLUS
INTRAVENOUS | Status: DC | PRN
  Administered 2016-08-05: 150 mg via INTRAVENOUS

## 2016-08-05 MED ORDER — LACTATED RINGERS IV SOLN
INTRAVENOUS | Status: DC
  Administered 2016-08-05: 07:00:00 via INTRAVENOUS

## 2016-08-05 MED ORDER — FENTANYL CITRATE (PF) 250 MCG/5ML IJ SOLN
INTRAMUSCULAR | Status: AC
  Filled 2016-08-05: qty 5

## 2016-08-05 MED ORDER — CIPROFLOXACIN IN D5W 400 MG/200ML IV SOLN
400.0000 mg | INTRAVENOUS | Status: AC
  Administered 2016-08-05: 400 mg via INTRAVENOUS

## 2016-08-05 MED ORDER — FERRIC SUBSULFATE 259 MG/GM EX SOLN
CUTANEOUS | Status: DC | PRN
  Administered 2016-08-05: 1

## 2016-08-05 MED ORDER — METRONIDAZOLE IN NACL 5-0.79 MG/ML-% IV SOLN
500.0000 mg | INTRAVENOUS | Status: AC
  Administered 2016-08-05: 500 mg via INTRAVENOUS

## 2016-08-05 MED ORDER — METRONIDAZOLE IN NACL 5-0.79 MG/ML-% IV SOLN
INTRAVENOUS | Status: AC
  Filled 2016-08-05: qty 100

## 2016-08-05 MED ORDER — LIDOCAINE HCL 1 % IJ SOLN
INTRAMUSCULAR | Status: DC | PRN
  Administered 2016-08-05: 35 mg via INTRADERMAL

## 2016-08-05 MED ORDER — LIDOCAINE HCL (PF) 1 % IJ SOLN
INTRAMUSCULAR | Status: AC
  Filled 2016-08-05: qty 5

## 2016-08-05 SURGICAL SUPPLY — 34 items
BAG HAMPER (MISCELLANEOUS) ×3 IMPLANT
BLADE SURG 15 STRL LF DISP TIS (BLADE) ×1 IMPLANT
BLADE SURG 15 STRL SS (BLADE) ×3
CATH ROBINSON RED A/P 16FR (CATHETERS) IMPLANT
CLOTH BEACON ORANGE TIMEOUT ST (SAFETY) ×3 IMPLANT
COVER LIGHT HANDLE STERIS (MISCELLANEOUS) ×6 IMPLANT
DECANTER SPIKE VIAL GLASS SM (MISCELLANEOUS) ×3 IMPLANT
DRAPE PROXIMA HALF (DRAPES) ×3 IMPLANT
ELECT REM PT RETURN 9FT ADLT (ELECTROSURGICAL) ×3
ELECTRODE REM PT RTRN 9FT ADLT (ELECTROSURGICAL) ×1 IMPLANT
FORMALIN 10 PREFIL 120ML (MISCELLANEOUS) ×5 IMPLANT
GLOVE BIOGEL PI IND STRL 7.0 (GLOVE) ×1 IMPLANT
GLOVE BIOGEL PI IND STRL 9 (GLOVE) ×1 IMPLANT
GLOVE BIOGEL PI INDICATOR 7.0 (GLOVE) ×2
GLOVE BIOGEL PI INDICATOR 9 (GLOVE) ×2
GLOVE ECLIPSE 9.0 STRL (GLOVE) ×3 IMPLANT
GOWN SPEC L3 XXLG W/TWL (GOWN DISPOSABLE) ×3 IMPLANT
GOWN STRL REUS W/TWL LRG LVL3 (GOWN DISPOSABLE) ×3 IMPLANT
HEMOSTAT SURGICEL 4X8 (HEMOSTASIS) ×2 IMPLANT
INST SET HYSTEROSCOPY (KITS) ×2 IMPLANT
IV NS IRRIG 3000ML ARTHROMATIC (IV SOLUTION) ×2 IMPLANT
KIT ROOM TURNOVER AP CYSTO (KITS) ×3 IMPLANT
MANIFOLD NEPTUNE II (INSTRUMENTS) ×3 IMPLANT
NDL HYPO 21X1.5 SAFETY (NEEDLE) ×1 IMPLANT
NEEDLE HYPO 21X1.5 SAFETY (NEEDLE) ×3 IMPLANT
PACK PERI GYN (CUSTOM PROCEDURE TRAY) ×3 IMPLANT
PAD ARMBOARD 7.5X6 YLW CONV (MISCELLANEOUS) ×3 IMPLANT
PAD TELFA 3X4 1S STER (GAUZE/BANDAGES/DRESSINGS) ×2 IMPLANT
SCOPETTES 8  STERILE (MISCELLANEOUS) ×2
SCOPETTES 8 STERILE (MISCELLANEOUS) IMPLANT
SET BASIN LINEN APH (SET/KITS/TRAYS/PACK) ×3 IMPLANT
SET CYSTO W/LG BORE CLAMP LF (SET/KITS/TRAYS/PACK) ×4 IMPLANT
SUT CHROMIC 2 0 CT 1 (SUTURE) ×7 IMPLANT
SYR CONTROL 10ML LL (SYRINGE) ×3 IMPLANT

## 2016-08-05 NOTE — Discharge Instructions (Signed)
Cervical Conization, Care After °This sheet gives you information about how to care for yourself after your procedure. Your doctor may also give you more specific instructions. If you have problems or questions, contact your doctor. °Follow these instructions at home: °Medicines °· Take over-the-counter and prescription medicines only as told by your doctor. °· Do not take aspirin until your doctor says it is okay. °· If you take pain medicine: °? You may have constipation. To help treat this, your doctor may tell you to: °§ Drink enough fluid to keep your pee (urine) clear or pale yellow. °§ Take medicines. °§ Eat foods that are high in fiber. These include fresh fruits and vegetables, whole grains, bran, and beans. °§ Limit foods that are high in fat and sugar. These include fried foods and sweet foods. °? Do not drive or use heavy machines. °General instructions °· You can eat your usual diet unless your doctor tells you not to do so. °· Take showers for the first week. Do not take baths, swim, or use hot tubs until your doctor says it is okay. °· Do not douche, use tampons, or have sex until your doctor says it is okay. °· For 7-14 days after your procedure, avoid: °? Being very active. °? Exercising. °? Heavy lifting. °· Keep all follow-up visits as told by your doctor. This is important. °Contact a doctor if: °· You have a rash. °· You are dizzy or lightheaded. °· You feel sick to your stomach (nauseous). °· You throw up (vomit). °· You have fluid from your vagina (vaginal discharge) that smells bad. °Get help right away if: °· There are blood clots coming from your vagina. °· You have more bleeding than you would have in a normal period. For example, you soak a pad in less than 1 hour. °· You have a fever. °· You have more and more cramps. °· You pass out (faint). °· You have pain when peeing. °· Your have a lot of pain. °· Your pain gets worse. °· Your pain does not get better when you take your  medicine. °· You have blood in your pee. °· You throw up (vomit). °Summary °· After your procedure, take over-the-counter and prescription medicines only as told by your doctor. °· Do not douche, use tampons, or have sex until your doctor says it is okay. °· For about 7-14 days after your procedure, try not to exercise or lift heavy objects. °· Get help right away if you have new symptoms, or if your symptoms become worse. °This information is not intended to replace advice given to you by your health care provider. Make sure you discuss any questions you have with your health care provider. °Document Released: 01/22/2008 Document Revised: 04/16/2016 Document Reviewed: 04/16/2016 °Elsevier Interactive Patient Education © 2017 [employer] ° °

## 2016-08-05 NOTE — Op Note (Signed)
Please see the brief operative note for surgical details 

## 2016-08-05 NOTE — H&P (Addendum)
Preoperative History and Physical  [name] is a 41 y.o. [phone] here for surgical management of CIN - III high grade squamous endocervical lesion.   No significant preoperative concerns. Patient is currently incarcerated.  Surgery was previously scheduled and cancelled due to the anemia , and pt received feraheme  additionally she complains of continued irregular menstrual bleeding. The surgery was postponed due to recent anemia. We performed  an ultrasound in the office to check out the endometrium. Because the cervix is stenotic  An endometrial biopsy cannot be done at this time. We will therefore add dilation and curettage to planned surgery on Tuesday Serum hCG and CBC are ordered today Proposed surgery: cold knife conization on cervix                                  Hysteroscopy,Dilation and curettage      Past Medical History:  Diagnosis Date  . Brain tumor ([place])   . Cancer ([place])   . Chronic back pain   . Noncompliance with medication regimen   . Seizures ([place])         Past Surgical History:  Procedure Laterality Date  . ABDOMINAL HYSTERECTOMY    . TUBAL LIGATION                     OB History  Gravida Para Term Preterm AB Living  3 3 3     3   SAB TAB Ectopic Multiple Live Births               # Outcome Date GA Lbr Len/2nd Weight Sex Delivery Anes PTL Lv  3 Term           2 Term           1 Term             Patient denies any other pertinent gynecologic issues.         Current Outpatient Prescriptions on File Prior to Visit  Medication Sig Dispense Refill  . levETIRAcetam (KEPPRA) 500 MG tablet Take 1 tablet (500 mg total) by mouth 2 (two) times daily. 60 tablet 0            Current Facility-Administered Medications on File Prior to Visit  Medication Dose Route Frequency Provider Last Rate Last Dose  . ferumoxytol (FERAHEME) 510 mg in sodium chloride 0.9 % 100 mL IVPB  510 mg Intravenous Once Jonnie Kind, MD             Allergies  Allergen Reactions  . Penicillins Rash    Has patient had a PCN reaction causing immediate rash, facial/tongue/throat swelling, SOB or lightheadedness with hypotension: Yes Has patient had a PCN reaction causing severe rash involving mucus membranes or skin necrosis: No Has patient had a PCN reaction that required hospitalization No Has patient had a PCN reaction occurring within the last 10 years: No  If all of the above answers are "NO", then may proceed with Cephalosporin use.     Social History:   reports that she has quit smoking. Her smoking use included Cigarettes. She has a 6.00 pack-year smoking history. She has never used smokeless tobacco. She reports that she does not drink alcohol or use drugs.        Family History  Problem Relation Age of Onset  . Other Father     shot  . Cancer Mother  Review of Systems: Noncontributory  PHYSICAL EXAM: Blood pressure 100/64, pulse 96, height 5\' 6"  (1.676 m), weight 195 lb (88.5 kg), last menstrual period 01/06/2015. General appearance - alert, well appearing, and in no distress Chest - clear to auscultation, no wheezes, rales or rhonchi, symmetric air entry Heart - normal rate and regular rhythm Abdomen - soft, nontender, nondistended, no masses or organomegaly                     Pelvic - examination normal, cervix is normal, uterus anterior Extremities - peripheral pulses normal, no pedal edema, no clubbing or cyanosis  Labs:      Results for orders placed or performed during the hospital encounter of 07/10/16 (from the past 336 hour(s))  CBC   Collection Time: 07/10/16  2:44 PM  Result Value Ref Range   WBC 5.2 4.0 - 10.5 K/uL   RBC 3.88 3.87 - 5.11 MIL/uL   Hemoglobin 8.6 (L) 12.0 - 15.0 g/dL   HCT 28.1 (L) 36.0 - 46.0 %   MCV 72.4 (L) 78.0 - 100.0 fL   MCH 22.2 (L) 26.0 - 34.0 pg   MCHC 30.6 30.0 - 36.0 g/dL   RDW 18.8 (H) 11.5 - 15.5 %   Platelets 215 150 - 400  K/uL  Comprehensive metabolic panel   Collection Time: 07/10/16  2:44 PM  Result Value Ref Range   Sodium 138 135 - 145 mmol/L   Potassium 4.6 3.5 - 5.1 mmol/L   Chloride 104 101 - 111 mmol/L   CO2 27 22 - 32 mmol/L   Glucose, Bld 102 (H) 65 - 99 mg/dL   BUN 15 6 - 20 mg/dL   Creatinine, Ser 0.81 0.44 - 1.00 mg/dL   Calcium 9.3 8.9 - 10.3 mg/dL   Total Protein 6.8 6.5 - 8.1 g/dL   Albumin 3.8 3.5 - 5.0 g/dL   AST 22 15 - 41 U/L   ALT 30 14 - 54 U/L   Alkaline Phosphatase 68 38 - 126 U/L   Total Bilirubin 0.3 0.3 - 1.2 mg/dL   GFR calc non Af Amer >60 >60 mL/min   GFR calc Af Amer >60 >60 mL/min   Anion gap 7 5 - 15  hCG, serum, qualitative   Collection Time: 07/10/16  2:44 PM  Result Value Ref Range   Preg, Serum NEGATIVE NEGATIVE    Imaging Studies: No results found.ltrasound ordered today. Moderate thickened of endometrium [name] a 15 [id number] LMP 06/25/2016 for a pelvic sonogram for abnormal uterine bleeding .  Uterus 8.1 x 5.3 x 5.6 cm, homogeneous anteverted uterus,wnl  Endometrium 18.3 mm, symmetrical, thickened endometrium  Right ovary 2.6 x 1.2 x 1.8 cm, wnl  Left ovary 2.8 x 1.8 x 2.1 cm, simple dominate left follicle 2.1 x 1.4 x 1.9 cm  No free fluid   Technician Comments:  PELVIC US TA/TV: homogeneous anteverted uterus,wnl,thickened endometrium 18.3 mm,normal ovaries bilat,simple dominate left follicle 2.1 x 1.4 x 1.9 cm,no free fluid,left adnexal pain during ultrasound,ovaries appear mobile    [employer] 07/24/2016 4:05 PM  Assessment:     Patient Active Problem List   Diagnosis Date Noted  . CIN III (cervical intraepithelial neoplasia grade III) with severe dysplasia 06/30/2016  2.Dysfunctional uterine bleeding  Plan: Patient will undergo surgical management with cold knife conization of cervix. CBC, quant serum pregnancy test, and [place]  today. Will add hysteroscopy, D&C to surgical procedure.    07/24/2016 2:19 PM  Electronically signed by Jonnie Kind, MD at 07/24/2016 9:08 PM

## 2016-08-05 NOTE — Anesthesia Postprocedure Evaluation (Signed)
Anesthesia Post Note  Patient: [name]  Procedure(s) Performed: Procedure(s) (LRB): COLD KNIFE CONIZATION OF CERVIX (N/A) DILATATION AND CURETTAGE /HYSTEROSCOPY (N/A)  Patient location during evaluation: PACU Anesthesia Type: General Level of consciousness: awake and alert, oriented and patient cooperative Pain management: pain level controlled Vital Signs Assessment: post-procedure vital signs reviewed and stable Respiratory status: spontaneous breathing, nonlabored ventilation and respiratory function stable Cardiovascular status: blood pressure returned to baseline Postop Assessment: no signs of nausea or vomiting Anesthetic complications: no     Last Vitals:  Vitals:   08/05/16 0842 08/05/16 0845  BP: 126/82 111/69  Pulse: 61 60  Resp:  13  Temp: 36.4 C     Last Pain:  Vitals:   08/05/16 0637  TempSrc: Oral  PainSc: 6                  Danique Hartsough J

## 2016-08-05 NOTE — Brief Op Note (Signed)
08/05/2016  8:43 AM  PATIENT:  [name]  41 y.o. female  PRE-OPERATIVE DIAGNOSIS:  cervical severe dysplasia  CIN III  POST-OPERATIVE DIAGNOSIS:  cervical severe dysplasia  CIN III  PROCEDURE:  Procedure(s) with comments: COLD KNIFE CONIZATION OF CERVIX (N/A) - patient is an inmate at jail, so she cannot be contacted directly--must call the nurse, Jeral Fruit,  at [facility] jail to discuss preop labs, etc. at [phone].  Cannot discuss anything directly with patient. Angie at Dr. Johnnye Sima to call [name] at jail to give arrival time on 08/05/16 @ 6:15am. DILATATION AND CURETTAGE /HYSTEROSCOPY (N/A)  SURGEON:  Surgeon(s) and Role:    * Jonnie Kind, MD - Primary  PHYSICIAN ASSISTANT:   ASSISTANTS: none Bonney Roussel CST  ANESTHESIA:   general and With LMA  EBL:  Total I/O In: 500 [I.V.:500] Out: 10 [Blood:10]  BLOOD ADMINISTERED:none  DRAINS: none   LOCAL MEDICATIONS USED:  MARCAINE    and Amount: 10 ml  SPECIMEN:  Source of Specimen:  Conization specimen, tagged at 12:00  DISPOSITION OF SPECIMEN:  PATHOLOGY  COUNTS:  YES  TOURNIQUET:  * No tourniquets in log *  DICTATION: .Dragon Dictation  PLAN OF CARE: Admit to inpatient   PATIENT DISPOSITION:  PACU - hemodynamically stable.   Delay start of Pharmacological VTE agent (>24hrs) due to surgical blood loss or risk of bleeding: not applicable Details of procedure: Patient was taken operating room prepped and draped for vaginal procedure was inserted. Was noted patient was not bleeding at all. Cervix was grasped at 12:00 with single-tooth tenaculum. The cervix was small and evidence of prior scarring from the previous cryocautery is evident. The uterus was sounded to a depth of 8 cm, sat dilated to 223 [place] which allowed introduction of the 30 operative hysteroscope which revealed tubal ostia, with intact uterine cavity. There was minimal amount of tissue in the uterine cavity. Efforts were performed.  If there was any area that was source of ongoing bleeding. During dilation had had some of the stenosis disrupted 12:00. There was little bit of bleeding at 12:00 probably related to the disruption of the stenosis. You could see on hysteroscopy this small area of bleeding as the there are otherwise no significant lesions. Smooth sharp curettage with a #2 curet was then performed obtaining a modest amount of tissue fragments and some blood. This was sent as a histologic specimen. Conization:. Position then followed. Marcaine with epinephrine was injected 10 cc through the cervical tissues, then a circumferential conization specimen   and using a #15 blade we circumscribed the exocervix and most of the scar tissue from the previous procedure risks and and a conical fashion trimmed down obtaining a conization specimen approximately 2 cm wide by 1/2 cm in depth with a very smooth margin. The specimen was passed off after being tagged at 12:00. The conization bed was then addressed by placing Sturmdorf suture at 11:00 to 1:00 and an at 4:00 to 8:00 with good eversion of the conization bed. Monsel's was applied, and a small bit of point cautery applied to the anterior cervix where there was an area that wanted to continue to bleed despite Monsel's. Surgicel was placed on the cervix once hemostasis was achieved and then the lateral sutures at 3:00 and 9:00 were used to loosely hold Surgicel in order to keep it in place. The patient will be discharged home and follow up in 1 week in our office

## 2016-08-05 NOTE — Transfer of Care (Signed)
Immediate Anesthesia Transfer of Care Note  Patient: [name]  Procedure(s) Performed: Procedure(s) with comments: COLD KNIFE CONIZATION OF CERVIX (N/A) - patient is an inmate at jail, so she cannot be contacted directly--must call the nurse, Jeral Fruit,  at [facility] jail to discuss preop labs, etc. at [phone].  Cannot discuss anything directly with patient. Angie at Dr. Johnnye Sima to call [name] at jail to give arrival time on 08/05/16 @ 6:15am. DILATATION AND CURETTAGE /HYSTEROSCOPY (N/A)  Patient Location: PACU  Anesthesia Type:General  Level of Consciousness: awake and patient cooperative  Airway & Oxygen Therapy: Patient Spontanous Breathing and Patient connected to face mask oxygen  Post-op Assessment: Report given to RN, Post -op Vital signs reviewed and stable and Patient moving all extremities  Post vital signs: Reviewed and stable  Last Vitals:  Vitals:   08/05/16 0637  BP: 131/74  Pulse: 62  Resp: 16  Temp: 36.6 C    Last Pain:  Vitals:   08/05/16 0637  TempSrc: Oral  PainSc: 6       Patients Stated Pain Goal: 7 (92/01/00 7121)  Complications: No apparent anesthesia complications

## 2016-08-05 NOTE — Anesthesia Procedure Notes (Signed)
Procedure Name: LMA Insertion Date/Time: 08/05/2016 7:41 AM Performed by: Charmaine Downs Pre-anesthesia Checklist: Patient identified, Patient being monitored, Emergency Drugs available, Timeout performed and Suction available Patient Re-evaluated:Patient Re-evaluated prior to inductionOxygen Delivery Method: Circle System Utilized Preoxygenation: Pre-oxygenation with 100% oxygen Intubation Type: IV induction Ventilation: Mask ventilation without difficulty LMA: LMA inserted LMA Size: 4.0 Number of attempts: 1 Placement Confirmation: positive ETCO2 and breath sounds checked- equal and bilateral Tube secured with: Tape Dental Injury: Teeth and Oropharynx as per pre-operative assessment

## 2016-08-05 NOTE — Anesthesia Preprocedure Evaluation (Signed)
Anesthesia Evaluation  Patient identified by MRN, date of birth, ID band Patient awake    Reviewed: Allergy & Precautions, NPO status , Patient's Chart, lab work & pertinent test results  Airway Mallampati: II  TM Distance: <3 FB     Dental  (+) Teeth Intact   Pulmonary former smoker,    breath sounds clear to auscultation       Cardiovascular negative cardio ROS   Rhythm:Regular Rate:Normal     Neuro/Psych Seizures - (hx brain tumor ), Well Controlled,     GI/Hepatic negative GI ROS,   Endo/Other    Renal/GU      Musculoskeletal   Abdominal   Peds  Hematology   Anesthesia Other Findings   Reproductive/Obstetrics                             Anesthesia Physical Anesthesia Plan  ASA: III  Anesthesia Plan: General   Post-op Pain Management:    Induction: Intravenous  Airway Management Planned: LMA  Additional Equipment:   Intra-op Plan:   Post-operative Plan: Extubation in OR  Informed Consent: I have reviewed the patients History and Physical, chart, labs and discussed the procedure including the risks, benefits and alternatives for the proposed anesthesia with the patient or authorized representative who has indicated his/her understanding and acceptance.     Plan Discussed with:   Anesthesia Plan Comments:         Anesthesia Quick Evaluation

## 2016-08-06 ENCOUNTER — Encounter (HOSPITAL_COMMUNITY): Payer: Self-pay | Admitting: Obstetrics and Gynecology

## 2016-08-13 ENCOUNTER — Encounter: Payer: Self-pay | Admitting: Obstetrics and Gynecology

## 2016-08-13 ENCOUNTER — Ambulatory Visit (INDEPENDENT_AMBULATORY_CARE_PROVIDER_SITE_OTHER): Payer: PRIVATE HEALTH INSURANCE | Admitting: Obstetrics and Gynecology

## 2016-08-13 ENCOUNTER — Encounter: Payer: PRIVATE HEALTH INSURANCE | Admitting: Obstetrics and Gynecology

## 2016-08-13 VITALS — BP 138/94 | HR 64 | Ht 66.0 in | Wt 197.4 lb

## 2016-08-13 DIAGNOSIS — D069 Carcinoma in situ of cervix, unspecified: Secondary | ICD-10-CM

## 2016-08-13 NOTE — Progress Notes (Addendum)
° °  Subjective:  [name] is a 41 y.o. female now 1 week status post COLD KNIFE CONIZATION OF CERVIX (N/A) - DILATATION AND CURETTAGE /HYSTEROSCOPY (N/A).   Review of Systems Negative aside from discharge   Diet:   normal   Bowel movements : having difficulty.  Pain is controlled without any medications.  Objective:  BP (!) 138/94 (BP Location: Left Arm, Patient Position: Sitting, Cuff Size: Normal)    Pulse 64    Ht 5\' 6"  (1.676 m)    Wt 197 lb 6.4 oz (89.5 kg)    LMP 08/02/2016    BMI 31.86 kg/m  General:Well developed, well nourished.  No acute distress. Abdomen: Bowel sounds normal, soft, non-tender. Pelvic Exam:    External Genitalia:  Normal.    Vagina: moderate watery, non-purulent discharge appropriate for post op state    Cervix: Normal    Uterus: Normal    Adnexa/Bimanual: Normal  Incision(s):   Healing well, no drainage, no erythema, no hernia, no swelling, no dehiscence,     Assessment:  Post-Op 1 week s/p COLD KNIFE CONIZATION OF CERVIX, DILATATION AND CURETTAGE /HYSTEROSCOPY    Doing well postoperatively. Residual endocervical disease (CIN III) Constipation    Plan:  1.Wound care discussed   2. . current medications. 3. Activity restrictions: do not use tampons 4. return to work: not applicable. 5. Follow up in  4 week for pre-op visit for abdominal hysterectomy . 6. Start Miralax for constipation   By signing my name below, I, Sonum Patel, attest that this documentation has been prepared under the direction and in the presence of Jonnie Kind, MD. Electronically Signed: Sonum Patel, Education administrator. 08/13/16. 3:18 PM.  I personally performed the services described in this documentation, which was SCRIBED in my presence. The recorded information has been reviewed and considered accurate. It has been edited as necessary during review. Jonnie Kind, MD

## 2016-08-24 ENCOUNTER — Emergency Department (HOSPITAL_COMMUNITY)
Admission: EM | Admit: 2016-08-24 | Discharge: 2016-08-24 | Disposition: A | Discharge status: Home / Self Care | Attending: Emergency Medicine | Admitting: Emergency Medicine

## 2016-08-24 ENCOUNTER — Encounter (HOSPITAL_COMMUNITY): Payer: Self-pay | Admitting: *Deleted

## 2016-08-24 DIAGNOSIS — N938 Other specified abnormal uterine and vaginal bleeding: Secondary | ICD-10-CM | POA: Insufficient documentation

## 2016-08-24 DIAGNOSIS — Z87891 Personal history of nicotine dependence: Secondary | ICD-10-CM | POA: Insufficient documentation

## 2016-08-24 DIAGNOSIS — Z79899 Other long term (current) drug therapy: Secondary | ICD-10-CM | POA: Diagnosis not present

## 2016-08-24 DIAGNOSIS — N939 Abnormal uterine and vaginal bleeding, unspecified: Secondary | ICD-10-CM | POA: Diagnosis present

## 2016-08-24 LAB — CBC WITH DIFFERENTIAL/PLATELET
BASOS PCT: 1 %
Basophils Absolute: 0.1 10*3/uL (ref 0.0–0.1)
EOS ABS: 0.2 10*3/uL (ref 0.0–0.7)
EOS PCT: 3 %
HEMATOCRIT: 32.8 % — AB (ref 36.0–46.0)
HEMOGLOBIN: 10.7 g/dL — AB (ref 12.0–15.0)
LYMPHS PCT: 33 %
Lymphs Abs: 2 10*3/uL (ref 0.7–4.0)
MCH: 26.1 pg (ref 26.0–34.0)
MCHC: 32.6 g/dL (ref 30.0–36.0)
MCV: 80 fL (ref 78.0–100.0)
Monocytes Absolute: 0.5 10*3/uL (ref 0.1–1.0)
Monocytes Relative: 8 %
NEUTROS ABS: 3.4 10*3/uL (ref 1.7–7.7)
Neutrophils Relative %: 55 %
PLATELETS: 194 10*3/uL (ref 150–400)
RBC: 4.1 MIL/uL (ref 3.87–5.11)
RDW: 20.8 % — AB (ref 11.5–15.5)
WBC: 6.2 10*3/uL (ref 4.0–10.5)

## 2016-08-24 LAB — BASIC METABOLIC PANEL
ANION GAP: 7 (ref 5–15)
BUN: 13 mg/dL (ref 6–20)
CO2: 26 mmol/L (ref 22–32)
Calcium: 9 mg/dL (ref 8.9–10.3)
Chloride: 104 mmol/L (ref 101–111)
Creatinine, Ser: 0.79 mg/dL (ref 0.44–1.00)
GFR calc non Af Amer: >60 mL/min (ref >60)
GLUCOSE: 94 mg/dL (ref 65–99)
Potassium: 4 mmol/L (ref 3.5–5.1)
Sodium: 137 mmol/L (ref 135–145)

## 2016-08-24 LAB — RAPID HIV SCREEN (HIV 1/2 AB+AG)
HIV 1/2 ANTIBODIES: NONREACTIVE
HIV-1 P24 Antigen - HIV24: NONREACTIVE

## 2016-08-24 LAB — URINALYSIS, ROUTINE W REFLEX MICROSCOPIC
BACTERIA UA: NONE SEEN
Bilirubin Urine: NEGATIVE
Glucose, UA: NEGATIVE mg/dL
Ketones, ur: NEGATIVE mg/dL
Leukocytes, UA: NEGATIVE
Nitrite: NEGATIVE
PROTEIN: NEGATIVE mg/dL
Specific Gravity, Urine: 1.013 (ref 1.005–1.030)
pH: 5 (ref 5.0–8.0)

## 2016-08-24 LAB — WET PREP, GENITAL
CLUE CELLS WET PREP: NONE SEEN
Sperm: NONE SEEN
Trich, Wet Prep: NONE SEEN
WBC WET PREP: NONE SEEN
YEAST WET PREP: NONE SEEN

## 2016-08-24 LAB — POC URINE PREG, ED: Preg Test, Ur: NEGATIVE

## 2016-08-24 MED ORDER — MEDROXYPROGESTERONE ACETATE 10 MG PO TABS
ORAL_TABLET | ORAL | Status: AC
  Filled 2016-08-24: qty 1

## 2016-08-24 MED ORDER — MEDROXYPROGESTERONE ACETATE 10 MG PO TABS: 10.0000 mg | ORAL_TABLET | Freq: Every day | ORAL | 0 refills | Status: DC

## 2016-08-24 MED ORDER — IBUPROFEN 600 MG PO TABS: 600.0000 mg | ORAL_TABLET | Freq: Four times a day (QID) | ORAL | 0 refills | Status: DC | PRN

## 2016-08-24 MED ORDER — KETOROLAC TROMETHAMINE 60 MG/2ML IM SOLN
60.0000 mg | Freq: Once | INTRAMUSCULAR | Status: AC
  Administered 2016-08-24: 60 mg via INTRAMUSCULAR
  Filled 2016-08-24: qty 2

## 2016-08-24 MED ORDER — MEDROXYPROGESTERONE ACETATE 10 MG PO TABS
10.0000 mg | ORAL_TABLET | Freq: Every day | ORAL | Status: DC
  Administered 2016-08-24: 10 mg via ORAL
  Filled 2016-08-24 (×2): qty 1

## 2016-08-24 NOTE — ED Provider Notes (Signed)
[facility] DEPT Provider Note   CSN: [id number] Arrival date & time: 08/24/16  1124  By signing my name below, I, Margit Banda, attest that this documentation has been prepared under the direction and in the presence of Evalee Jefferson, PA-C.  Electronically Signed: Margit Banda, ED Scribe. 08/24/16. 12:35 PM.  History   Chief Complaint Chief Complaint  Patient presents with  . Vaginal Bleeding    HPI [name] is a 41 y.o. female with a PMHx of blood clots and a PSHx of cervical conization, who presents to the Emergency Department complaining of severe vaginal bleeding that started 2 days ago. Pt had conization of cervix done on 08/05/16 and was doing well after procedure. Pt started passing large blood clots ~ 2 days ago. Pt notes bleeding is so bad that yesterday, she used ~ 30 pads in a 12 hour period, and she is currently wearing 4 at the moment. Associated sx include abdominal pain, lower back pain, weakness and light-headedness. She will be undergoing a hysterectomy in the near future. Reports normal menstrual periods before surgery. Pt denies dysuria, nausea, and vomiting.   The history is provided by the patient. No language interpreter was used.    Past Medical History:  Diagnosis Date  . Brain tumor ([place])   . Cancer ([place])   . Chronic back pain   . Noncompliance with medication regimen   . Seizures [facility])     Patient Active Problem List   Diagnosis Date Noted  . 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 ABDOMINAL HYSTERECTOMY    . CERVICAL CONIZATION W/BX N/A 08/05/2016   Procedure: COLD KNIFE CONIZATION OF CERVIX;  Surgeon: Jonnie Kind, MD;  Location: [facility] ORS;  Service: Gynecology;  Laterality: N/A;  patient is an inmate at jail, so she cannot be contacted directly--must call the nurse, Jeral Fruit,  at [facility] jail to discuss preop labs, etc. at [phone].  Cannot discuss anything directly with  patient. Angie at Dr. Johnnye Sima to call [name] at jail   . HYSTEROSCOPY W/D&C N/A 08/05/2016   Procedure: DILATATION AND CURETTAGE /HYSTEROSCOPY;  Surgeon: Jonnie Kind, MD;  Location: [facility] ORS;  Service: Gynecology;  Laterality: N/A;  . TUBAL LIGATION      OB History    Gravida Para Term Preterm AB Living   3 3 3     3    SAB TAB Ectopic Multiple Live Births                   Home Medications    Prior to Admission medications   Medication Sig Start Date End Date Taking? Authorizing Provider  acetaminophen-codeine (TYLENOL #3) 300-30 MG tablet Take 2 tablets by mouth every 4 (four) hours as needed for moderate pain. 08/05/16  Yes Jonnie Kind, MD  levETIRAcetam (KEPPRA) 500 MG tablet Take 1 tablet (500 mg total) by mouth 2 (two) times daily. 10/29/15  Yes Elnora Morrison, MD  ibuprofen (ADVIL,MOTRIN) 600 MG tablet Take 1 tablet (600 mg total) by mouth every 6 (six) hours as needed. 08/24/16   Evalee Jefferson, PA-C  medroxyPROGESTERone (PROVERA) 10 MG tablet Take 1 tablet (10 mg total) by mouth daily. 08/24/16 09/03/16  Evalee Jefferson, PA-C    Family History Family History  Problem Relation Age of Onset  . Other Father     shot  . Cancer Mother     Social History Social History  Substance Use  Topics  . Smoking status: Former Smoker    Packs/day: 0.50    Years: 12.00    Types: Cigarettes  . Smokeless tobacco: Never Used  . Alcohol use No     Comment: history     Allergies   Penicillins   Review of Systems Review of Systems  Gastrointestinal: Negative for nausea and vomiting.  Genitourinary: Positive for pelvic pain and vaginal bleeding. Negative for dysuria.  Musculoskeletal: Positive for back pain.  Neurological: Positive for weakness and light-headedness.     Physical Exam Updated Vital Signs BP 130/86   Pulse 77   Temp 97.6 F (36.4 C) (Oral)   Resp 18   Ht 5\' 6"  (1.676 m)   Wt 88.9 kg   LMP 08/02/2016   SpO2 100%   BMI 31.64 kg/m   Physical Exam    Constitutional: She appears well-developed and well-nourished. No distress.  HENT:  Head: Normocephalic and atraumatic.  Eyes:  Pale conjunctiva.  Neck: Normal range of motion.  Cardiovascular: Normal rate, regular rhythm and normal heart sounds.   No murmur heard. Pulmonary/Chest: Effort normal and breath sounds normal. No respiratory distress.  Abdominal: Soft. Bowel sounds are normal. She exhibits no distension and no mass. There is tenderness in the suprapubic area and left lower quadrant. There is no rebound and no guarding.  Genitourinary: Uterus is tender. Uterus is not enlarged. Cervix exhibits no motion tenderness, no discharge and no friability. Right adnexum displays no mass, no tenderness and no fullness. Left adnexum displays no mass, no tenderness and no fullness. There is bleeding in the vagina.  Genitourinary Comments: Moderate bleeding from cervical os. Two quarter sized clots in vagina.  Musculoskeletal: Normal range of motion.  Neurological: She is alert.  Skin: No pallor.  Psychiatric: She has a normal mood and affect. Her behavior is normal.  Nursing note and vitals reviewed.    ED Treatments / Results  DIAGNOSTIC STUDIES: Oxygen Saturation is 98% on RA, normal by my interpretation.   COORDINATION OF CARE: 11:57 AM-Discussed next steps with pt which includes blood work to make sure she isn't anemic, and a pelvic exam to make sure there isn't anything abnormal. Pt verbalized understanding and is agreeable with the plan.   Labs (all labs ordered are listed, but only abnormal results are displayed) Labs Reviewed  CBC WITH DIFFERENTIAL/PLATELET - Abnormal; Notable for the following:       Result Value   Hemoglobin 10.7 (*)    HCT 32.8 (*)    RDW 20.8 (*)    All other components within normal limits  URINALYSIS, ROUTINE W REFLEX MICROSCOPIC - Abnormal; Notable for the following:    Hgb urine dipstick MODERATE (*)    Squamous Epithelial / LPF 0-5 (*)    All  other components within normal limits  WET PREP, GENITAL  BASIC METABOLIC PANEL  HIV ANTIBODY (ROUTINE TESTING)  RAPID HIV SCREEN (HIV 1/2 AB+AG)  POC URINE PREG, ED  GC/CHLAMYDIA PROBE AMP ([place]) NOT AT [facility]    EKG  EKG Interpretation None       Radiology No results found.  Procedures Procedures (including critical care time)  Medications Ordered in ED Medications  ketorolac (TORADOL) injection 60 mg (60 mg Intramuscular Given 08/24/16 1224)     Initial Impression / Assessment and Plan / ED Course  I have reviewed the triage vital signs and the nursing notes.  Pertinent labs & imaging results that were available during my care of the patient were  reviewed by me and considered in my medical decision making (see chart for details).     Review of chart indicates pt with history of vaginal hysterectomy, however, [place] dated 07/24/16 confirms uterus, right and left ovaries present, thickened endometrium.  She underwent a cold knife conization on 4/10 secondary to cervical dysplasia also with D&C at same time.  She has a history of DUB which has responded to provera in the past.  She is scheduled to see Dr Glo Herring within the next 2 weeks to arrange full hysterectomy.  She was encouraged to keep this appt.  She was placed on provera today.  Her vital signs have been stable here, no change in her mild anemia compared to prior cbc obtained at the time of her procedure.   Final Clinical Impressions(s) / ED Diagnoses   Final diagnoses:  Dysfunctional uterine bleeding    New Prescriptions Discharge Medication List as of 08/24/2016  3:36 PM    START taking these medications   Details  medroxyPROGESTERone (PROVERA) 10 MG tablet Take 1 tablet (10 mg total) by mouth daily., Starting Sun 08/24/2016, Until Wed 09/03/2016, Print       I personally performed the services described in this documentation, which was scribed in my presence. The recorded information has been reviewed and  is accurate.    Evalee Jefferson, PA-C 08/25/16 [place], MD 08/28/16 Dyann Kief

## 2016-08-24 NOTE — Discharge Instructions (Signed)
Take your next dose of the provera tomorrow, you have received todays dose here.  Get rechecked by Dr Glo Herring or by returning here if you have any worsened bleeding or you have weakness or dizziness.  If the provera stops your bleeding you can plan to see Dr. Glo Herring at your already scheduled visit.

## 2016-08-24 NOTE — ED Triage Notes (Signed)
Pt had conization of cervix done on 4/10. States she began bleeding on the 27th and started having lower abdominal pain as well. She states she is passing large clots.

## 2016-08-25 LAB — HIV ANTIBODY (ROUTINE TESTING W REFLEX): HIV Screen 4th Generation wRfx: NONREACTIVE

## 2016-08-25 LAB — GC/CHLAMYDIA PROBE AMP (~~LOC~~) NOT AT ARMC
Chlamydia: NEGATIVE
Neisseria Gonorrhea: NEGATIVE

## 2016-09-10 ENCOUNTER — Encounter: Payer: PRIVATE HEALTH INSURANCE | Admitting: Obstetrics and Gynecology

## 2016-10-02 ENCOUNTER — Telehealth: Payer: Self-pay | Admitting: Obstetrics and Gynecology

## 2016-10-02 NOTE — Telephone Encounter (Signed)
I left a message with Leveda Anna, nurse at the [facility] center asking her to update her office on the status on Mrs. [name]. She previously was to receive a hysterectomy in May or June and has not made follow-up arrangements in our office thank you

## 2016-10-14 ENCOUNTER — Telehealth: Payer: Self-pay | Admitting: Obstetrics and Gynecology

## 2016-10-14 NOTE — Telephone Encounter (Signed)
[facility] now [facility] [place] and I have called to leave a message to ensure that she gets follow-up regarding her endocervical severe dysplasia which appears to be present at the endocervical margin of her recent conization At a minimum the patient will need repeat colposcopy and consideration of a LEEP procedure as outpatient however we were considering proceeding with vaginal hysterectomy. I'll relay this information to [facility] when the returned my call

## 2017-11-05 ENCOUNTER — Encounter (HOSPITAL_COMMUNITY): Payer: Self-pay

## 2017-11-05 ENCOUNTER — Emergency Department (HOSPITAL_COMMUNITY)
Admission: EM | Admit: 2017-11-05 | Discharge: 2017-11-06 | Disposition: A | Discharge status: Home / Self Care | Payer: Medicaid Other | Attending: Emergency Medicine | Admitting: Emergency Medicine

## 2017-11-05 DIAGNOSIS — R0789 Other chest pain: Secondary | ICD-10-CM | POA: Diagnosis not present

## 2017-11-05 DIAGNOSIS — Z87891 Personal history of nicotine dependence: Secondary | ICD-10-CM | POA: Insufficient documentation

## 2017-11-05 DIAGNOSIS — R079 Chest pain, unspecified: Secondary | ICD-10-CM | POA: Diagnosis present

## 2017-11-05 DIAGNOSIS — F419 Anxiety disorder, unspecified: Secondary | ICD-10-CM | POA: Insufficient documentation

## 2017-11-05 DIAGNOSIS — M79642 Pain in left hand: Secondary | ICD-10-CM | POA: Insufficient documentation

## 2017-11-05 DIAGNOSIS — Z79899 Other long term (current) drug therapy: Secondary | ICD-10-CM | POA: Diagnosis not present

## 2017-11-05 DIAGNOSIS — Z859 Personal history of malignant neoplasm, unspecified: Secondary | ICD-10-CM | POA: Diagnosis not present
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
## 2017-11-06 ENCOUNTER — Emergency Department (HOSPITAL_COMMUNITY): Payer: Medicaid Other

## 2017-11-06 ENCOUNTER — Encounter (HOSPITAL_COMMUNITY): Payer: Self-pay | Admitting: Emergency Medicine

## 2017-11-06 ENCOUNTER — Other Ambulatory Visit: Payer: Self-pay

## 2017-11-06 LAB — COMPREHENSIVE METABOLIC PANEL
ALBUMIN: 3.7 g/dL (ref 3.5–5.0)
ALK PHOS: 93 U/L (ref 38–126)
ALT: 40 U/L (ref 0–44)
AST: 21 U/L (ref 15–41)
Anion gap: 7 (ref 5–15)
BUN: 22 mg/dL — AB (ref 6–20)
CHLORIDE: 107 mmol/L (ref 98–111)
CO2: 26 mmol/L (ref 22–32)
CREATININE: 0.97 mg/dL (ref 0.44–1.00)
Calcium: 9 mg/dL (ref 8.9–10.3)
GFR calc Af Amer: >60 mL/min (ref >60)
GFR calc non Af Amer: >60 mL/min (ref >60)
Glucose, Bld: 127 mg/dL — ABNORMAL HIGH (ref 70–99)
Potassium: 3.7 mmol/L (ref 3.5–5.1)
SODIUM: 140 mmol/L (ref 135–145)
TOTAL PROTEIN: 6.7 g/dL (ref 6.5–8.1)
Total Bilirubin: 0.4 mg/dL (ref 0.3–1.2)

## 2017-11-06 LAB — I-STAT TROPONIN, ED
Troponin i, poc: 0 ng/mL (ref 0.00–0.08)
Troponin i, poc: 0 ng/mL (ref 0.00–0.08)

## 2017-11-06 LAB — CBC WITH DIFFERENTIAL/PLATELET
Basophils Absolute: 0 10*3/uL (ref 0.0–0.1)
Basophils Relative: 0 %
EOS ABS: 0.2 10*3/uL (ref 0.0–0.7)
Eosinophils Relative: 2 %
HCT: 40.7 % (ref 36.0–46.0)
HEMOGLOBIN: 13.6 g/dL (ref 12.0–15.0)
LYMPHS ABS: 2.7 10*3/uL (ref 0.7–4.0)
Lymphocytes Relative: 31 %
MCH: 31.6 pg (ref 26.0–34.0)
MCHC: 33.4 g/dL (ref 30.0–36.0)
MCV: 94.7 fL (ref 78.0–100.0)
MONOS PCT: 10 %
Monocytes Absolute: 0.8 10*3/uL (ref 0.1–1.0)
Neutro Abs: 5 10*3/uL (ref 1.7–7.7)
Neutrophils Relative %: 57 %
PLATELETS: 191 10*3/uL (ref 150–400)
RBC: 4.3 MIL/uL (ref 3.87–5.11)
RDW: 13.5 % (ref 11.5–15.5)
WBC: 8.7 10*3/uL (ref 4.0–10.5)

## 2017-11-06 MED ORDER — IBUPROFEN 800 MG PO TABS: 800.0000 mg | ORAL_TABLET | Freq: Three times a day (TID) | ORAL | 0 refills | Status: DC

## 2017-11-06 MED ORDER — KETOROLAC TROMETHAMINE 30 MG/ML IJ SOLN
INTRAMUSCULAR | Status: AC
  Filled 2017-11-06: qty 1

## 2017-11-06 MED ORDER — KETOROLAC TROMETHAMINE 30 MG/ML IJ SOLN
30.0000 mg | Freq: Once | INTRAMUSCULAR | Status: AC
  Administered 2017-11-06: 30 mg via INTRAVENOUS
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
## 2017-11-06 NOTE — ED Notes (Signed)
Pt now able to move arms and legs  No drift noted.

## 2017-11-09 ENCOUNTER — Other Ambulatory Visit: Payer: Self-pay

## 2017-11-09 ENCOUNTER — Ambulatory Visit: Payer: Medicaid Other | Admitting: Obstetrics and Gynecology

## 2017-11-09 ENCOUNTER — Encounter: Payer: Self-pay | Admitting: Obstetrics and Gynecology

## 2017-11-09 VITALS — BP 134/78 | HR 81 | Ht 66.0 in | Wt 204.0 lb

## 2017-11-09 DIAGNOSIS — R102 Pelvic and perineal pain: Secondary | ICD-10-CM | POA: Diagnosis not present

## 2017-11-09 DIAGNOSIS — N3946 Mixed incontinence: Secondary | ICD-10-CM | POA: Diagnosis not present

## 2017-11-09 DIAGNOSIS — D069 Carcinoma in situ of cervix, unspecified: Secondary | ICD-10-CM

## 2017-11-09 MED ORDER — OXYBUTYNIN CHLORIDE 5 MG PO TABS: 5.0000 mg | ORAL_TABLET | Freq: Two times a day (BID) | ORAL | 3 refills | Status: DC
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
## 2017-11-10 ENCOUNTER — Emergency Department (HOSPITAL_COMMUNITY): Payer: Medicaid Other

## 2017-11-10 ENCOUNTER — Other Ambulatory Visit: Payer: Self-pay

## 2017-11-10 ENCOUNTER — Encounter (HOSPITAL_COMMUNITY): Payer: Self-pay

## 2017-11-10 ENCOUNTER — Emergency Department (HOSPITAL_COMMUNITY)
Admission: EM | Admit: 2017-11-10 | Discharge: 2017-11-10 | Disposition: A | Discharge status: Home / Self Care | Payer: Medicaid Other | Attending: Emergency Medicine | Admitting: Emergency Medicine

## 2017-11-10 DIAGNOSIS — Y9389 Activity, other specified: Secondary | ICD-10-CM | POA: Insufficient documentation

## 2017-11-10 DIAGNOSIS — R569 Unspecified convulsions: Secondary | ICD-10-CM

## 2017-11-10 DIAGNOSIS — Y998 Other external cause status: Secondary | ICD-10-CM | POA: Diagnosis not present

## 2017-11-10 DIAGNOSIS — Z79899 Other long term (current) drug therapy: Secondary | ICD-10-CM | POA: Insufficient documentation

## 2017-11-10 DIAGNOSIS — T07XXXA Unspecified multiple injuries, initial encounter: Secondary | ICD-10-CM

## 2017-11-10 DIAGNOSIS — Z85841 Personal history of malignant neoplasm of brain: Secondary | ICD-10-CM | POA: Insufficient documentation

## 2017-11-10 DIAGNOSIS — W010XXA Fall on same level from slipping, tripping and stumbling without subsequent striking against object, initial encounter: Secondary | ICD-10-CM | POA: Insufficient documentation

## 2017-11-10 DIAGNOSIS — Y929 Unspecified place or not applicable: Secondary | ICD-10-CM | POA: Insufficient documentation

## 2017-11-10 DIAGNOSIS — T148XXA Other injury of unspecified body region, initial encounter: Secondary | ICD-10-CM | POA: Diagnosis not present

## 2017-11-10 DIAGNOSIS — F17210 Nicotine dependence, cigarettes, uncomplicated: Secondary | ICD-10-CM | POA: Diagnosis not present

## 2017-11-10 LAB — CBC WITH DIFFERENTIAL/PLATELET
BASOS ABS: 0 10*3/uL (ref 0.0–0.1)
Basophils Relative: 0 %
EOS ABS: 0.1 10*3/uL (ref 0.0–0.7)
EOS PCT: 1 %
HCT: 40.6 % (ref 36.0–46.0)
Hemoglobin: 13.8 g/dL (ref 12.0–15.0)
Lymphocytes Relative: 13 %
Lymphs Abs: 1 10*3/uL (ref 0.7–4.0)
MCH: 32.2 pg (ref 26.0–34.0)
MCHC: 34 g/dL (ref 30.0–36.0)
MCV: 94.6 fL (ref 78.0–100.0)
MONO ABS: 0.6 10*3/uL (ref 0.1–1.0)
Monocytes Relative: 8 %
Neutro Abs: 6.3 10*3/uL (ref 1.7–7.7)
Neutrophils Relative %: 78 %
PLATELETS: 175 10*3/uL (ref 150–400)
RBC: 4.29 MIL/uL (ref 3.87–5.11)
RDW: 13.1 % (ref 11.5–15.5)
WBC: 8.1 10*3/uL (ref 4.0–10.5)

## 2017-11-10 LAB — BASIC METABOLIC PANEL
Anion gap: 6 (ref 5–15)
BUN: 14 mg/dL (ref 6–20)
CALCIUM: 9.3 mg/dL (ref 8.9–10.3)
CO2: 29 mmol/L (ref 22–32)
CREATININE: 0.88 mg/dL (ref 0.44–1.00)
Chloride: 104 mmol/L (ref 98–111)
GFR calc Af Amer: >60 mL/min (ref >60)
GFR calc non Af Amer: >60 mL/min (ref >60)
GLUCOSE: 106 mg/dL — AB (ref 70–99)
Potassium: 4 mmol/L (ref 3.5–5.1)
SODIUM: 139 mmol/L (ref 135–145)

## 2017-11-10 MED ORDER — LEVETIRACETAM IN NACL 500 MG/100ML IV SOLN
500.0000 mg | Freq: Once | INTRAVENOUS | Status: AC
  Administered 2017-11-10: 500 mg via INTRAVENOUS
  Filled 2017-11-10: qty 100

## 2017-11-10 MED ORDER — METHOCARBAMOL 500 MG PO TABS: 500.0000 mg | ORAL_TABLET | Freq: Three times a day (TID) | ORAL | 0 refills | Status: DC | PRN

## 2017-11-10 MED ORDER — OXYCODONE-ACETAMINOPHEN 5-325 MG PO TABS
1.0000 | ORAL_TABLET | Freq: Once | ORAL | Status: AC
  Administered 2017-11-10: 1 via ORAL
  Filled 2017-11-10: qty 1

## 2017-11-10 NOTE — ED Provider Notes (Signed)
[facility] EMERGENCY DEPARTMENT Provider Note   CSN: [id number] Arrival date & time: 11/10/17  1232     History   Chief Complaint Chief Complaint  Patient presents with  . Seizures    HPI [name] is a 42 y.o. female.  HPI Patient presents after reported seizure.  States she is been under a lot of stress and did not sleep well last night.  Reportedly had a witnessed fall with seizure that lasted 10 minutes.  Had some incontinence.  EMS took around 25 minutes to arrive and patient was not postictal at that time.  States she has pain to her left shoulder.  Reportedly hurt this in the fall.  Complaining of pain in left neck also.  Pain is worse with movements.  She is on Keppra.  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  Has some history of noncompliance.  Last seizure was reportedly when she was in prison.  She did not take her Keppra dose this morning. Past Medical History:  Diagnosis Date  . Brain tumor ([place])   . Cancer ([place])   . Chronic back pain   . Noncompliance with medication regimen   . Seizures [facility])     Patient Active Problem List   Diagnosis Date Noted  . Mixed incontinence urge and stress 11/09/2017    Past Surgical History:  Procedure Laterality Date  . ABDOMINAL HYSTERECTOMY    . CERVICAL CONIZATION W/BX N/A 08/05/2016   Procedure: COLD KNIFE CONIZATION OF CERVIX;  Surgeon: Jonnie Kind, MD;  Location: [facility];  Service: Gynecology;  Laterality: N/A;  patient is an inmate at jail, so she cannot be contacted directly--must call the nurse, Jeral Fruit,  at [facility] jail to discuss preop labs, etc. at [phone].  Cannot discuss anything directly with patient. Angie at Dr. Johnnye Sima to call [name] at jail   . HYSTEROSCOPY W/D&C N/A 08/05/2016   Procedure: DILATATION AND CURETTAGE /HYSTEROSCOPY;  Surgeon: Jonnie Kind, MD;  Location: [facility];  Service: Gynecology;  Laterality: N/A;  . TUBAL LIGATION       OB History    Gravida  3   Para  3    Term  3   Preterm      AB      Living  3     SAB      TAB      Ectopic      Multiple      Live Births               Home Medications    Prior to Admission medications   Medication Sig Start Date End Date Taking? Authorizing Provider  ibuprofen (ADVIL,MOTRIN) 800 MG tablet Take 1 tablet (800 mg total) by mouth 3 (three) times daily. 11/06/17  Yes Pollina, Gwenyth Allegra, MD  levETIRAcetam (KEPPRA) 500 MG tablet Take 1 tablet (500 mg total) by mouth 2 (two) times daily. 10/29/15  Yes Elnora Morrison, MD  medroxyPROGESTERone (PROVERA) 10 MG tablet Take 1 tablet (10 mg total) by mouth daily. 08/24/16 09/03/16  Evalee Jefferson, PA-C  methocarbamol (ROBAXIN) 500 MG tablet Take 1 tablet (500 mg total) by mouth every 8 (eight) hours as needed for muscle spasms. 11/10/17   Davonna Belling, MD  oxybutynin (DITROPAN) 5 MG tablet Take 1 tablet (5 mg total) by mouth 2 (two) times daily. Patient not taking: Reported on 11/10/2017 11/09/17   Jonnie Kind, MD    Family History Family History  Problem Relation Age of Onset  . Other Father        shot  . Cancer Mother     Social History Social History   Tobacco Use  . Smoking status: Current Every Day Smoker    Packs/day: 0.50    Years: 12.00    Pack years: 6.00    Types: Cigarettes  . Smokeless tobacco: Never Used  Substance Use Topics  . Alcohol use: No    Comment: history  . Drug use: No    Types: "Crack" cocaine, Marijuana    Comment: 1-2 weekly     Allergies   Penicillins   Review of Systems Review of Systems  Constitutional: Negative for appetite change.  HENT: Negative for congestion.   Respiratory: Negative for shortness of breath.   Cardiovascular: Positive for chest pain.  Gastrointestinal: Negative for abdominal distention.  Genitourinary: Negative for flank pain.  Musculoskeletal: Positive for back pain and neck pain.  Skin: Negative for wound.  Neurological: Positive for seizures.  Hematological:  Negative for adenopathy.  Psychiatric/Behavioral: Negative for confusion.     Physical Exam Updated Vital Signs BP 104/79   Pulse 82   Temp 97.8 F (36.6 C) (Oral)   Resp 19   Ht 5\' 6"  (1.676 m)   Wt 90.7 kg (200 lb)   LMP 08/02/2016   SpO2 100%   BMI 32.28 kg/m   Physical Exam  Constitutional: She appears well-developed.  HENT:  Head: Atraumatic.  Eyes: EOM are normal.  Neck:  Tenderness over midline cervical spine.  Cardiovascular: Normal rate.  Pulmonary/Chest: She exhibits tenderness.  Tenderness over left lateral posterior chest wall.  Tenderness over scapula.  No crepitance or deformity.  Equal breath sounds.  Abdominal: There is no tenderness.  Musculoskeletal: She exhibits tenderness.  Tenderness over cervical and thoracic spine.  No step-off or deformity.  Tenderness over scapula.  Some tenderness over right-sided posterior chest wall.  No extremity tenderness.  Good range of motion in left shoulder.  Skin: Skin is warm. Capillary refill takes less than 2 seconds.     ED Treatments / Results  Labs (all labs ordered are listed, but only abnormal results are displayed) Labs Reviewed  BASIC METABOLIC PANEL - Abnormal; Notable for the following components:      Result Value   Glucose, Bld 106 (*)    All other components within normal limits  CBC WITH DIFFERENTIAL/PLATELET    EKG EKG Interpretation  Date/Time:  Tuesday November 10 2017 12:38:32 EDT Ventricular Rate:  94 PR Interval:    QRS Duration: 93 QT Interval:  358 QTC Calculation: 448 R Axis:   81 Text Interpretation:  Sinus rhythm Borderline short PR interval Confirmed by Davonna Belling [phone]) on 11/10/2017 2:38:53 PM   Radiology Dg Ribs Unilateral W/chest Left  Result Date: 11/10/2017 CLINICAL DATA:  Pain following fall EXAM: LEFT RIBS AND CHEST - 3+ VIEW COMPARISON:  Chest radiograph November 06, 2017 FINDINGS: Frontal chest as well as oblique and cone-down rib images were obtained. Lungs are  clear. Heart size and pulmonary vascularity are normal. No adenopathy. No pleural effusion or pneumothorax evident. No apparent rib fracture. IMPRESSION: No rib fracture evident.  Lungs clear. Electronically Signed   By: Lowella Grip III M.D.   On: 11/10/2017 14:32   Dg Scapula Left  Result Date: 11/10/2017 CLINICAL DATA:  Pain following fall EXAM: LEFT SCAPULA - 2+ VIEWS COMPARISON:  None. FINDINGS: Frontal and lateral views obtained. No fracture or dislocation. Joint spaces appear  normal. No erosive change. Visualized left lung clear. IMPRESSION: No fracture or dislocation.  No evident arthropathy. Electronically Signed   By: Lowella Grip III M.D.   On: 11/10/2017 14:31   Ct Cervical Spine Wo Contrast  Result Date: 11/10/2017 CLINICAL DATA:  The patient suffered a seizure with a fall today. Neck pain. Initial encounter. EXAM: CT CERVICAL SPINE WITHOUT CONTRAST TECHNIQUE: Multidetector CT imaging of the cervical spine was performed without intravenous contrast. Multiplanar CT image reconstructions were also generated. COMPARISON:  CT cervical spine 07/27/2010. FINDINGS: Alignment: Maintained. Skull base and vertebrae: No acute fracture. No primary bone lesion or focal pathologic process. Soft tissues and spinal canal: No prevertebral fluid or swelling. No visible canal hematoma. Disc levels: Intervertebral disc space height is maintained at all levels. Upper chest: None lung apices clear. Other: Cervical spine CT scan. IMPRESSION: Negative Electronically Signed   By: Inge Rise M.D.   On: 11/10/2017 14:10    Procedures Procedures (including critical care time)  Medications Ordered in ED Medications  levETIRAcetam (KEPPRA) IVPB 500 mg/100 mL premix (0 mg Intravenous Stopped 11/10/17 1337)  oxyCODONE-acetaminophen (PERCOCET/ROXICET) 5-325 MG per tablet 1 tablet (1 tablet Oral Given 11/10/17 1540)     Initial Impression / Assessment and Plan / ED Course  I have reviewed the triage  vital signs and the nursing notes.  Pertinent labs & imaging results that were available during my care of the patient were reviewed by me and considered in my medical decision making (see chart for details).     Patient with seizures.  Noncompliance with medication.  X-rays reassuring with her multiple pain areas.  Discharged home with muscle relaxers.  Given IV Keppra here to get her back to therapeutic.  Final Clinical Impressions(s) / ED Diagnoses   Final diagnoses:  Seizure [facility])  Multiple contusions    ED Discharge Orders        Ordered    methocarbamol (ROBAXIN) 500 MG tablet  Every 8 hours PRN     11/10/17 1612       Davonna Belling, MD 11/10/17 2149
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
## 2017-11-17 ENCOUNTER — Other Ambulatory Visit: Payer: Self-pay | Admitting: Obstetrics and Gynecology

## 2017-11-17 DIAGNOSIS — R102 Pelvic and perineal pain: Secondary | ICD-10-CM

## 2017-11-18 ENCOUNTER — Ambulatory Visit (INDEPENDENT_AMBULATORY_CARE_PROVIDER_SITE_OTHER): Payer: Medicaid Other

## 2017-11-18 DIAGNOSIS — R102 Pelvic and perineal pain: Secondary | ICD-10-CM | POA: Diagnosis not present

## 2017-11-18 NOTE — Progress Notes (Signed)
Pelvic US TA/TV:normal vaginal cuff,ovaries not visualized,no free fluid,pelvic pain during ultrasound 

## 2017-11-23 ENCOUNTER — Ambulatory Visit (INDEPENDENT_AMBULATORY_CARE_PROVIDER_SITE_OTHER): Payer: Medicaid Other | Admitting: Obstetrics and Gynecology

## 2017-11-23 ENCOUNTER — Encounter: Payer: Self-pay | Admitting: Obstetrics and Gynecology

## 2017-11-23 ENCOUNTER — Telehealth: Payer: Self-pay | Admitting: Obstetrics and Gynecology

## 2017-11-23 VITALS — BP 123/80 | HR 92 | Ht 66.0 in | Wt 206.0 lb

## 2017-11-23 DIAGNOSIS — R102 Pelvic and perineal pain: Secondary | ICD-10-CM | POA: Diagnosis not present

## 2017-11-23 DIAGNOSIS — N895 Stricture and atresia of vagina: Secondary | ICD-10-CM | POA: Diagnosis not present

## 2017-11-23 DIAGNOSIS — F17210 Nicotine dependence, cigarettes, uncomplicated: Secondary | ICD-10-CM | POA: Diagnosis not present

## 2017-11-23 MED ORDER — TRAMADOL HCL 50 MG PO TABS: 50.0000 mg | ORAL_TABLET | Freq: Four times a day (QID) | ORAL | 0 refills | Status: DC | PRN

## 2017-11-23 NOTE — Telephone Encounter (Signed)
Called patient and informed her that she can let [facility] know where to deliver her medicine.

## 2017-11-23 NOTE — Telephone Encounter (Signed)
Patient called stating that Dr. Glo Herring called in a medication for her today and patient would like to know if he could get it delivered to [address] in [place] which is her mother's address. Please contact pt

## 2017-11-23 NOTE — Progress Notes (Signed)
Patient ID: [name], female   DOB: 11-Aug-1975, 42 y.o.   MRN: [id number]    [facility] Visit  @DATE @            Patient name: [name] MRN [id number]  Date of birth: 1975/09/24  CC & HPI:  [name] is a 42 y.o. female presenting today for follow up of u/s done on 11/18/17. She says that the pain in her vagina keeps her from doing day to day activities. She says that the ibuprofen doesn't help. She had a seizure on 11/10/17 and was given percocet-5 while in hospital and it relieved her pain in her vagina. History of substance abuse, so addiction risk exists.  ROS:  ROS +vaginal discomfort. -fever -chills All systems are negative except as noted in the HPI and PMH.   Pertinent History Reviewed:   Reviewed: Significant for Hystorectomy Medical         Past Medical History:  Diagnosis Date  . Brain tumor ([place])   . Cancer ([place])   . Chronic back pain   . Noncompliance with medication regimen   . Seizures ([place])                               Surgical Hx:    Past Surgical History:  Procedure Laterality Date  . ABDOMINAL HYSTERECTOMY    . CERVICAL CONIZATION W/BX N/A 08/05/2016   Procedure: COLD KNIFE CONIZATION OF CERVIX;  Surgeon: Jonnie Kind, MD;  Location: [facility];  Service: Gynecology;  Laterality: N/A;  patient is an inmate at jail, so she cannot be contacted directly--must call the nurse, Jeral Fruit,  at [facility] jail to discuss preop labs, etc. at [phone].  Cannot discuss anything directly with patient. Angie at Dr. Johnnye Sima to call [name] at jail   . HYSTEROSCOPY W/D&C N/A 08/05/2016   Procedure: DILATATION AND CURETTAGE /HYSTEROSCOPY;  Surgeon: Jonnie Kind, MD;  Location: [facility];  Service: Gynecology;  Laterality: N/A;  . TUBAL LIGATION     Medications: Reviewed & Updated - see associated section                       Current Outpatient Medications:  .  ibuprofen (ADVIL,MOTRIN) 800 MG tablet, Take 1 tablet (800 mg total) by mouth 3  (three) times daily., Disp: 21 tablet, Rfl: 0 .  levETIRAcetam (KEPPRA) 500 MG tablet, Take 1 tablet (500 mg total) by mouth 2 (two) times daily., Disp: 60 tablet, Rfl: 0 .  oxybutynin (DITROPAN) 5 MG tablet, Take 1 tablet (5 mg total) by mouth 2 (two) times daily., Disp: 60 tablet, Rfl: 3   Social History: Reviewed -  reports that she has been smoking cigarettes.  She has a 6.00 pack-year smoking history. She has never used smokeless tobacco.  Objective Findings:  Vitals: Blood pressure 123/80, pulse 92, height 5\' 6"  (1.676 m), weight 206 lb (93.4 kg), last menstrual period 08/02/2016.  PHYSICAL EXAMINATION General appearance - alert, well appearing, and in no distress, oriented to person, place, and time and in mild to moderate distress Mental status - alert, oriented to person, place, and time, normal mood, behavior, speech, dress, motor activity, and thought processes, affect appropriate to mood  PELVIC Vagina - nodule of scar tissue in vaginal cuff Cervix - nodule of scar tisue Uterus - surgically absent  Assessment & Plan:   A:  1.  Nodule on vaginal cuff with adhesions 2. Trial of tramadol  For pain relief  P:  1.  Recommendation of laparoscopy and excision of vaginal cuff nodule in August with Trimming of vaginal cuff nodule. 2. Rx tramadol  50 mg x 30 tabs.    By signing my name below, I, Samul Dada, attest that this documentation has been prepared under the direction and in the presence of Jonnie Kind, MD. Electronically Signed: [place]. 11/23/17. 2:47 PM.  I personally performed the services described in this documentation, which was SCRIBED in my presence. The recorded information has been reviewed and considered accurate. It has been edited as necessary during review. Jonnie Kind, MD

## 2017-11-24 NOTE — Progress Notes (Deleted)
Psychiatric Initial Adult Assessment   Patient Identification: [name] MRN:  [id number] Date of Evaluation:  11/24/2017 Referral Source: *** Chief Complaint:   Visit Diagnosis: No diagnosis found.  History of Present Illness:   [name] is a 42 y.o. year old female with a history of seizure disorder, brain tumor by report, who is referred for     Associated Signs/Symptoms: Depression Symptoms:  {DEPRESSION SYMPTOMS:20000} (Hypo) Manic Symptoms:  {BHH MANIC SYMPTOMS:22872} Anxiety Symptoms:  {BHH ANXIETY SYMPTOMS:22873} Psychotic Symptoms:  {BHH PSYCHOTIC SYMPTOMS:22874} PTSD Symptoms: {BHH PTSD SYMPTOMS:22875}  Past Psychiatric History:  Outpatient:  Psychiatry admission:  Previous suicide attempt:  Past trials of medication:  History of violence:   Previous Psychotropic Medications: {YES/NO:21197}  Substance Abuse History in the last 12 months:  {yes no:314532}  Consequences of Substance Abuse: {BHH CONSEQUENCES OF SUBSTANCE ABUSE:22880}  Past Medical History:  Past Medical History:  Diagnosis Date  . Brain tumor ([place])   . Cancer ([place])   . Chronic back pain   . Noncompliance with medication regimen   . Seizures ([place])     Past Surgical History:  Procedure Laterality Date  . ABDOMINAL HYSTERECTOMY    . CERVICAL CONIZATION W/BX N/A 08/05/2016   Procedure: COLD KNIFE CONIZATION OF CERVIX;  Surgeon: Jonnie Kind, MD;  Location: [facility];  Service: Gynecology;  Laterality: N/A;  patient is an inmate at jail, so she cannot be contacted directly--must call the nurse, Jeral Fruit,  at [facility] jail to discuss preop labs, etc. at [phone].  Cannot discuss anything directly with patient. Angie at Dr. Johnnye Sima to call [name] at jail   . HYSTEROSCOPY W/D&C N/A 08/05/2016   Procedure: DILATATION AND CURETTAGE /HYSTEROSCOPY;  Surgeon: Jonnie Kind, MD;  Location: [facility];  Service: Gynecology;  Laterality: N/A;  . TUBAL LIGATION      Family Psychiatric  History: ***  Family History:  Family History  Problem Relation Age of Onset  . Other Father        shot  . Cancer Mother     Social History:   Social History   Socioeconomic History  . Marital status: Legally Separated    Spouse name: Not on file  . Number of children: Not on file  . Years of education: Not on file  . Highest education level: Not on file  Occupational History  . Not on file  Social Needs  . Financial resource strain: Not on file  . Food insecurity:    Worry: Not on file    Inability: Not on file  . Transportation needs:    Medical: Not on file    Non-medical: Not on file  Tobacco Use  . Smoking status: Current Every Day Smoker    Packs/day: 0.50    Years: 12.00    Pack years: 6.00    Types: Cigarettes  . Smokeless tobacco: Never Used  Substance and Sexual Activity  . Alcohol use: No    Comment: history  . Drug use: No    Types: "Crack" cocaine, Marijuana    Comment: 1-2 weekly  . Sexual activity: Not Currently    Birth control/protection: Surgical    Comment: hyst  Lifestyle  . Physical activity:    Days per week: Not on file    Minutes per session: Not on file  . Stress: Not on file  Relationships  . Social connections:    Talks on phone: Not on file    Gets together: Not on file  Attends religious service: Not on file    Active member of club or organization: Not on file    Attends meetings of clubs or organizations: Not on file    Relationship status: Not on file  Other Topics Concern  . Not on file  Social History Narrative  . Not on file    Additional Social History: ***  Allergies:   Allergies  Allergen Reactions  . Penicillins Rash    Has patient had a PCN reaction causing immediate rash, facial/tongue/throat swelling, SOB or lightheadedness with hypotension: Yes Has patient had a PCN reaction causing severe rash involving mucus membranes or skin necrosis: No Has patient had a PCN reaction that required hospitalization  No Has patient had a PCN reaction occurring within the last 10 years: No  If all of the above answers are "NO", then may proceed with Cephalosporin use.     Metabolic Disorder Labs: No results found for: HGBA1C, MPG No results found for: PROLACTIN No results found for: CHOL, TRIG, HDL, CHOLHDL, VLDL, LDLCALC   Current Medications: Current Outpatient Medications  Medication Sig Dispense Refill  . ibuprofen (ADVIL,MOTRIN) 800 MG tablet Take 1 tablet (800 mg total) by mouth 3 (three) times daily. 21 tablet 0  . levETIRAcetam (KEPPRA) 500 MG tablet Take 1 tablet (500 mg total) by mouth 2 (two) times daily. 60 tablet 0  . oxybutynin (DITROPAN) 5 MG tablet Take 1 tablet (5 mg total) by mouth 2 (two) times daily. 60 tablet 3  . traMADol (ULTRAM) 50 MG tablet Take 1 tablet (50 mg total) by mouth every 6 (six) hours as needed for moderate pain or severe pain. 30 tablet 0   No current facility-administered medications for this visit.     Neurologic: Headache: No Seizure: No Paresthesias:No  Musculoskeletal: Strength & Muscle Tone: within normal limits Gait & Station: normal Patient leans: N/A  Psychiatric Specialty Exam: ROS  Last menstrual period 08/02/2016.There is no height or weight on file to calculate BMI.  General Appearance: Fairly Groomed  Eye Contact:  Good  Speech:  Clear and Coherent  Volume:  Normal  Mood:  {BHH MOOD:22306}  Affect:  {Affect (PAA):22687}  Thought Process:  Coherent  Orientation:  Full (Time, Place, and Person)  Thought Content:  Logical  Suicidal Thoughts:  {ST/HT (PAA):22692}  Homicidal Thoughts:  {ST/HT (PAA):22692}  Memory:  Immediate;   Good  Judgement:  Good  Insight:  {Insight (PAA):22695}  Psychomotor Activity:  Normal  Concentration:  Concentration: Good and Attention Span: Good  Recall:  Good  Fund of Knowledge:Good  Language: Good  Akathisia:  No  Handed:  Right  AIMS (if indicated):  N/A  Assets:  Communication Skills Desire  for Improvement  ADL's:  Intact  Cognition: WNL  Sleep:  ***   Assessment  Plan  The patient demonstrates the following risk factors for suicide: Chronic risk factors for suicide include: {Chronic Risk Factors for XTGGYIR:48546270}. Acute risk factors for suicide include: {Acute Risk Factors for JJKKXFG:18299371}. Protective factors for this patient include: {Protective Factors for Suicide IRCV:89381017}. Considering these factors, the overall suicide risk at this point appears to be {Desc; low/moderate/high:110033}. Patient {ACTION; IS/IS PZW:25852778} appropriate for outpatient follow up.   Treatment Plan Summary: Plan as above   Norman Clay, MD 7/30/201911:32 AM

## 2017-12-01 ENCOUNTER — Ambulatory Visit (HOSPITAL_COMMUNITY): Payer: Self-pay | Admitting: Psychiatry

## 2017-12-09 ENCOUNTER — Ambulatory Visit: Payer: Self-pay | Admitting: Obstetrics and Gynecology

## 2017-12-09 ENCOUNTER — Encounter: Payer: Self-pay | Admitting: Obstetrics and Gynecology

## 2017-12-09 VITALS — BP 132/91 | HR 79 | Ht 66.0 in | Wt 206.0 lb

## 2017-12-09 DIAGNOSIS — R102 Pelvic and perineal pain: Secondary | ICD-10-CM

## 2017-12-09 NOTE — Progress Notes (Signed)
Patient ID: [name], female   DOB: Jun 16, 1975, 42 y.o.   MRN: [id number]    [facility] Visit  @DATE @            Patient name: [name] MRN [id number]  Date of birth: 07-21-1975  CC & HPI:  [name] is a 42 y.o. female presenting today for f/u to transvaginal u/s. No relief of symptoms while on Tramadol which made her vomit.  Patient's living with her mother and complains that her mother cooks and she has to eat too much and is gaining weight  ROS:  ROS +vaginal tenderness -fever -chills All systems are negative except as noted in the HPI and PMH.  Pertinent History Reviewed:   Reviewed: Significant for hysterectomy Medical         Past Medical History:  Diagnosis Date   Brain tumor ([place])    Cancer ([place])    Chronic back pain    Noncompliance with medication regimen    Seizures ([place])                               Surgical Hx:    Past Surgical History:  Procedure Laterality Date   ABDOMINAL HYSTERECTOMY     CERVICAL CONIZATION W/BX N/A 08/05/2016   Procedure: COLD KNIFE CONIZATION OF CERVIX;  Surgeon: Jonnie Kind, MD;  Location: [facility];  Service: Gynecology;  Laterality: N/A;  patient is an inmate at jail, so she cannot be contacted directly--must call the nurse, Jeral Fruit,  at [facility] jail to discuss preop labs, etc. at [phone].  Cannot discuss anything directly with patient. Angie at Dr. Johnnye Sima to call [name] at jail    HYSTEROSCOPY W/D&C N/A 08/05/2016   Procedure: [place] /HYSTEROSCOPY;  Surgeon: Jonnie Kind, MD;  Location: [facility];  Service: Gynecology;  Laterality: N/A;   TUBAL LIGATION     Medications: Reviewed & Updated - see associated section                       Current Outpatient Medications:    levETIRAcetam (KEPPRA) 500 MG tablet, Take 1 tablet (500 mg total) by mouth 2 (two) times daily., Disp: 60 tablet, Rfl: 0   oxybutynin (DITROPAN) 5 MG tablet, Take 1 tablet (5 mg total) by mouth 2  (two) times daily., Disp: 60 tablet, Rfl: 3   ibuprofen (ADVIL,MOTRIN) 800 MG tablet, Take 1 tablet (800 mg total) by mouth 3 (three) times daily. (Patient not taking: Reported on 12/09/2017), Disp: 21 tablet, Rfl: 0   traMADol (ULTRAM) 50 MG tablet, Take 1 tablet (50 mg total) by mouth every 6 (six) hours as needed for moderate pain or severe pain. (Patient not taking: Reported on 12/09/2017), Disp: 30 tablet, Rfl: 0   Social History: Reviewed -  reports that she has been smoking cigarettes. She has a 6.00 pack-year smoking history. She has never used smokeless tobacco.  Objective Findings:  Vitals: Blood pressure (!) 132/91, pulse 79, height 5\' 6"  (1.676 m), weight 206 lb (93.4 kg), last menstrual period 08/02/2016.  PHYSICAL EXAMINATION General appearance - alert, well appearing, and in no distress and oriented to person, place, and time Mental status - alert, oriented to person, place, and time, normal mood, behavior, speech, dress, motor activity, and thought processes, affect appropriate to mood  PELVIC Vagina - vaginal 72mm nodule, right at the cuff not felt to  be clinically significant Cervix - surgically absent Uterus - surgically absent Bowel palpable just behind the vaginal cuff there may be adhesions Patient is still wearing her ankle bracelet due to recent incarceration Assessment & Plan:   A:  1. Chronic constipation 2. 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 Weight loss management 2. Discontinue Tramadol and begin stool softeners 3. Laparoscopy to be scheduled prn after patient has lost weight 4. F/u in 1 month.  Importance the patient not sabotage efforts to address pain by continued weight gain discussed    By signing my name below, I, Samul Dada, attest that this documentation has been prepared under the direction and in the presence of Jonnie Kind, MD. Electronically Signed: [place]. 12/09/17. 2:31 PM.  I personally  performed the services described in this documentation, which was SCRIBED in my presence. The recorded information has been reviewed and considered accurate. It has been edited as necessary during review. Jonnie Kind, MD

## 2017-12-17 ENCOUNTER — Encounter: Payer: Self-pay | Admitting: Physician Assistant

## 2017-12-17 ENCOUNTER — Ambulatory Visit: Admitting: Physician Assistant

## 2017-12-17 VITALS — BP 136/83 | HR 91 | Temp 97.9°F | Ht 65.5 in | Wt 204.0 lb

## 2017-12-17 DIAGNOSIS — E669 Obesity, unspecified: Secondary | ICD-10-CM

## 2017-12-17 DIAGNOSIS — R102 Pelvic and perineal pain: Secondary | ICD-10-CM

## 2017-12-17 DIAGNOSIS — G40909 Epilepsy, unspecified, not intractable, without status epilepticus: Secondary | ICD-10-CM

## 2017-12-17 DIAGNOSIS — Z7689 Persons encountering health services in other specified circumstances: Secondary | ICD-10-CM

## 2017-12-17 DIAGNOSIS — Q2730 Arteriovenous malformation, site unspecified: Secondary | ICD-10-CM

## 2017-12-17 DIAGNOSIS — G8929 Other chronic pain: Secondary | ICD-10-CM

## 2017-12-17 DIAGNOSIS — F17200 Nicotine dependence, unspecified, uncomplicated: Secondary | ICD-10-CM

## 2017-12-17 MED ORDER — LEVETIRACETAM 500 MG PO TABS: 500.0000 mg | ORAL_TABLET | Freq: Two times a day (BID) | ORAL | 0 refills | Status: DC

## 2017-12-17 NOTE — Progress Notes (Signed)
BP 136/83 (BP Location: Right Arm, Patient Position: Sitting, Cuff Size: Normal)   Pulse 91   Temp 97.9 F (36.6 C)   Ht 5' 5.5" (1.664 m)   Wt 204 lb (92.5 kg)   LMP 08/02/2016   SpO2 95%   BMI 33.43 kg/m    Subjective:    Patient ID: [name], female    DOB: [birth year], 42 y.o.   MRN: [id number]  HPI: [name] is a 42 y.o. female presenting on 12/17/2017 for New Patient (Initial Visit) (previous pt with Dr. Legrand Rams pt has f/u with him on 01-21-18. pt sees Dr. Glo Herring.)   HPI      Pt has been seeing dr Legrand Rams.  She says she only went once.   She has follow up appointment scheduled there.    She says she is not going to return there and is going to cancel her appointment  Pt being followed by Dr Glo Herring for chronic pelvic pain  Pt got out of prison June 13  She was in for 2 1/2 years for larceny  Pt's neurologist in the past was dr Merlene Laughter.  She says she had appt with him earlier this week but she didn't go because her medicaid got cancelled and she didn't have $300 that his office required she pay in advance of being seen.   She says social security told her they would get her an appintment with neurologist and pay for her to go.  She says she is supposed to get a letter from them in 2 weeks  Pt says her medicaid "should kick in" soon  Pt with history seizure disorder since approximately 2006.  This is reportedly due to AVM   She says she has a brain tumor and says she "still got it".  Reviewed most recent CT and MRI and they only show AVM, no tumor.  Discussed with pt and she says she never heard of an AVM.      Pt with history noncompliance, paricularly with respect to skipping doses of her keppra.  She was seen in ER last month for having a seizure due to skipped medication.  Pt did not bring her bottle with her but says she only has 4 tablets remaining.   Relevant past medical, surgical, family and social history reviewed and updated as indicated. Interim medical  history since our last visit reviewed. Allergies and medications reviewed and updated.   Current Outpatient Medications:  .  ibuprofen (ADVIL,MOTRIN) 800 MG tablet, Take 1 tablet (800 mg total) by mouth 3 (three) times daily., Disp: 21 tablet, Rfl: 0 .  levETIRAcetam (KEPPRA) 500 MG tablet, Take 1 tablet (500 mg total) by mouth 2 (two) times daily., Disp: 60 tablet, Rfl: 0   Review of Systems  Constitutional: Negative for appetite change, chills, diaphoresis, fatigue, fever and unexpected weight change.  HENT: Positive for dental problem. Negative for congestion, drooling, ear pain, facial swelling, hearing loss, mouth sores, sneezing, sore throat, trouble swallowing and voice change.   Eyes: Negative for pain, discharge, redness, itching and visual disturbance.  Respiratory: Negative for cough, choking, shortness of breath and wheezing.   Cardiovascular: Negative for chest pain, palpitations and leg swelling.  Gastrointestinal: Negative for abdominal pain, blood in stool, constipation, diarrhea and vomiting.  Endocrine: Negative for cold intolerance, heat intolerance and polydipsia.  Genitourinary: Negative for decreased urine volume, dysuria and hematuria.  Musculoskeletal: Positive for back pain. Negative for arthralgias and gait problem.  Skin: Negative for rash.  Allergic/Immunologic: Negative for environmental allergies.  Neurological: Positive for seizures. Negative for syncope, light-headedness and headaches.  Hematological: Negative for adenopathy.  Psychiatric/Behavioral: Positive for dysphoric mood. Negative for agitation and suicidal ideas. The patient is not nervous/anxious.     Per HPI unless specifically indicated above     Objective:    BP 136/83 (BP Location: Right Arm, Patient Position: Sitting, Cuff Size: Normal)   Pulse 91   Temp 97.9 F (36.6 C)   Ht 5' 5.5" (1.664 m)   Wt 204 lb (92.5 kg)   LMP 08/02/2016   SpO2 95%   BMI 33.43 kg/m   Wt Readings from  Last 3 Encounters:  12/17/17 204 lb (92.5 kg)  12/09/17 206 lb (93.4 kg)  11/23/17 206 lb (93.4 kg)    Physical Exam  Constitutional: She is oriented to person, place, and time. She appears well-developed and well-nourished.  HENT:  Head: Normocephalic and atraumatic.  Mouth/Throat: Oropharynx is clear and moist. No oropharyngeal exudate.  Eyes: Pupils are equal, round, and reactive to light. Conjunctivae and EOM are normal.  Neck: Neck supple. No thyromegaly present.  Cardiovascular: Normal rate and regular rhythm.  Pulmonary/Chest: Effort normal and breath sounds normal.  Abdominal: Soft. Bowel sounds are normal. She exhibits no mass. There is no hepatosplenomegaly. There is no tenderness.  Musculoskeletal: She exhibits no edema.  Lymphadenopathy:    She has no cervical adenopathy.  Neurological: She is alert and oriented to person, place, and time. Gait normal.  Skin: Skin is warm and dry.  Psychiatric: She has a normal mood and affect. Her behavior is normal.  Vitals reviewed.   Results for orders placed or performed during the hospital encounter of 44/03/47  Basic metabolic panel  Result Value Ref Range   Sodium 139 135 - 145 mmol/L   Potassium 4.0 3.5 - 5.1 mmol/L   Chloride 104 98 - 111 mmol/L   CO2 29 22 - 32 mmol/L   Glucose, Bld 106 (H) 70 - 99 mg/dL   BUN 14 6 - 20 mg/dL   Creatinine, Ser 0.88 0.44 - 1.00 mg/dL   Calcium 9.3 8.9 - 10.3 mg/dL   GFR calc non Af Amer >60 >60 mL/min   GFR calc Af Amer >60 >60 mL/min   Anion gap 6 5 - 15  CBC with Differential  Result Value Ref Range   WBC 8.1 4.0 - 10.5 K/uL   RBC 4.29 3.87 - 5.11 MIL/uL   Hemoglobin 13.8 12.0 - 15.0 g/dL   HCT 40.6 36.0 - 46.0 %   MCV 94.6 78.0 - 100.0 fL   MCH 32.2 26.0 - 34.0 pg   MCHC 34.0 30.0 - 36.0 g/dL   RDW 13.1 11.5 - 15.5 %   Platelets 175 150 - 400 K/uL   Neutrophils Relative % 78 %   Neutro Abs 6.3 1.7 - 7.7 K/uL   Lymphocytes Relative 13 %   Lymphs Abs 1.0 0.7 - 4.0 K/uL    Monocytes Relative 8 %   Monocytes Absolute 0.6 0.1 - 1.0 K/uL   Eosinophils Relative 1 %   Eosinophils Absolute 0.1 0.0 - 0.7 K/uL   Basophils Relative 0 %   Basophils Absolute 0.0 0.0 - 0.1 K/uL      Assessment & Plan:   Encounter Diagnoses  Name Primary?  . Encounter to establish care Yes  . Seizure disorder ([place])   . AVM (arteriovenous malformation)   . Chronic pelvic pain in female   . Tobacco use  disorder   . Obesity, unspecified classification, unspecified obesity type, unspecified whether serious comorbidity present      -Refer to neurologist for seizure disorder and AVM -pt was given rx keppra.  Discussed she will need to get antiseizure medication from her neurologist -discussed with pt what an AVM is and gave her handout.  She may need repeat imaging and possible referral for surgery.  Will have neurology make recommendations  -pt to continue with Dr Glo Herring for chronic pelvic pain -[facility].  She has already applied for medicaid -no additional labs at this time -pt to follow up here 1 month.  RTO sooner prn

## 2017-12-17 NOTE — Patient Instructions (Signed)
Brain Arteriovenous Malformation, Adult A brain arteriovenous malformation (AVM) is a condition that means your arteries and veins are tangled. The veins bring blood to the heart and lungs. The arteries bring blood away from the heart and to the brain. If they are tangled, blood cannot travel to where it is needed. Brain AVM may also lead to bleeding in the brain (hemorrhage), which can be life-threatening. Most brain AVMs are present since birth (congenital). What are the causes? It is not known what causes brain AVM. Genes that are passed down through families may cause some types of AVM. What are the signs or symptoms? Symptoms of this condition depend on which area of the brain is affected and the amount of damage. Symptoms may include:  Headache.  Dizziness.  Vision changes.  Seizure.  Weakness or loss of movement in part of the body.  Tingling or numbness in part of the body.  Loss of ability to speak.  Clumsiness.  Confusion or memory loss.  Seeing things that are not there (hallucinations).  Fainting, if the AVM ruptures.  You may not have any symptoms. How is this diagnosed? Your health care provider may suspect a brain AVM based on your symptoms and medical history. You will have a physical exam and will also have tests done, which may include:  MRI or magnetic resonance angiogram (MRA).  CT scan or CT angiogram (CTA).  Cerebral angiogram.  Electroencephalogram (EEG) if your health care provider thinks that you have had a seizure.  How is this treated? Treatment will depend on the size, location, and severity of the brain AVM. Treatment may include:  Surgery to remove the AVM (craniotomy).  Embolization. This involves closing off the vessels of the AVM by injecting glue into them.  Radiosurgery. This involves focusing radiation on the AVM.  Medicines to control your symptoms, such as seizures or headaches.  Monitoring the AVM with imaging tests to make  sure it is not growing.  Follow these instructions at home:  Learn as much as you can about your condition and work closely with your team of health care providers.  Take over-the-counter and prescription medicines only as told by your health care provider. Do not take blood thinners, such as aspirin, ibuprofen, NSAIDs, or warfarin, unless your health care provider tells you to do that.  Keep all follow-up visits as told by your health care provider. This is important. Get help right away if:  You have a sudden, severe headache with no known cause.  You have nausea or vomiting occurring with another symptom.  You have sudden weakness or numbness of your face, arm, or leg, especially on one side of your body.  You have sudden trouble walking or difficulty moving your arms or legs.  You have sudden confusion.  You have sudden trouble speaking, understanding, or both (aphasia).  You have sudden trouble seeing in one or both of your eyes.  You have a sudden loss of balance or coordination.  You have a stiff neck.  You have difficulty breathing.  You have partial or total loss of consciousness. These symptoms may represent a serious problem that is an emergency. Do not wait to see if the symptoms will go away. Get medical help right away. Call your local emergency services (911 in the [place] Do not drive yourself to the hospital. This information is not intended to replace advice given to you by your health care provider. Make sure you discuss any questions you have with your  health care provider. Document Released: 04/04/2002 Document Revised: 11/02/2015 Document Reviewed: 08/24/2015 Elsevier Interactive Patient Education  [employer].

## 2018-01-06 ENCOUNTER — Ambulatory Visit: Payer: PRIVATE HEALTH INSURANCE | Admitting: Obstetrics and Gynecology

## 2018-01-13 ENCOUNTER — Ambulatory Visit: Payer: PRIVATE HEALTH INSURANCE | Admitting: Obstetrics and Gynecology

## 2018-01-18 ENCOUNTER — Ambulatory Visit: Payer: Medicaid Other | Admitting: Physician Assistant

## 2018-01-19 ENCOUNTER — Ambulatory Visit: Admitting: Physician Assistant

## 2018-02-01 ENCOUNTER — Encounter: Payer: Self-pay | Admitting: Physician Assistant

## 2018-02-13 ENCOUNTER — Emergency Department (HOSPITAL_COMMUNITY)
Admission: EM | Admit: 2018-02-13 | Discharge: 2018-02-13 | Discharge status: Against Medical Advice | Attending: Emergency Medicine | Admitting: Emergency Medicine

## 2018-02-13 ENCOUNTER — Encounter (HOSPITAL_COMMUNITY): Payer: Self-pay | Admitting: Emergency Medicine

## 2018-02-13 ENCOUNTER — Other Ambulatory Visit: Payer: Self-pay

## 2018-02-13 DIAGNOSIS — G40909 Epilepsy, unspecified, not intractable, without status epilepticus: Secondary | ICD-10-CM | POA: Insufficient documentation

## 2018-02-13 DIAGNOSIS — R569 Unspecified convulsions: Secondary | ICD-10-CM

## 2018-02-13 DIAGNOSIS — Z79899 Other long term (current) drug therapy: Secondary | ICD-10-CM | POA: Insufficient documentation

## 2018-02-13 DIAGNOSIS — F17210 Nicotine dependence, cigarettes, uncomplicated: Secondary | ICD-10-CM | POA: Insufficient documentation

## 2018-02-13 MED ORDER — LEVETIRACETAM 500 MG PO TABS
500.0000 mg | ORAL_TABLET | Freq: Once | ORAL | Status: AC
  Administered 2018-02-13: 500 mg via ORAL
  Filled 2018-02-13: qty 1

## 2018-02-13 NOTE — ED Provider Notes (Signed)
[place] EMERGENCY DEPARTMENT Provider Note   CSN: [id number] Arrival date & time: 02/13/18  1207     History   Chief Complaint Chief Complaint  Patient presents with  . Seizures    HPI [name] is a 42 y.o. female.  42 y/o female with a PMH of Seizures presents to the ED via EMS with a chief complaint of seizure x this morning. Patient had a reported witnessed clonic tonic seizure of unknown duration. EMS reported patient was postictal on their arrival, color appropriate for ethnicity, incontinent of urine. Patient reports missing dose of Keppra yesterday, denies ETOH or drugs. Patient is refusing any evaluation or further examination. She states "im ready to go, my sugar daddy is coming to get me". I have offered patient her dose of keppra she reports she will take it but does not want any further workup for her seizure.        Past Medical History:  Diagnosis Date  . Brain tumor ([place])   . 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 Chronic back pain   . Noncompliance with medication regimen   . Seizures [facility])     Patient Active Problem List   Diagnosis Date Noted  . Mixed incontinence urge and stress 11/09/2017    Past Surgical History:  Procedure Laterality Date  . ABDOMINAL HYSTERECTOMY    . CERVICAL CONIZATION W/BX N/A 08/05/2016   Procedure: COLD KNIFE CONIZATION OF CERVIX;  Surgeon: Jonnie Kind, MD;  Location: [facility] ORS;  Service: Gynecology;  Laterality: N/A;  patient is an inmate at jail, so she cannot be contacted directly--must call the nurse, Jeral Fruit,  at [facility] jail to discuss preop labs, etc. at [phone].  Cannot discuss anything directly with patient. Angie at Dr. Johnnye Sima to call [name] at jail   . HYSTEROSCOPY W/D&C N/A 08/05/2016   Procedure: DILATATION AND CURETTAGE /HYSTEROSCOPY;  Surgeon: Jonnie Kind, MD;  Location: [facility] ORS;  Service: Gynecology;  Laterality: N/A;  . TUBAL LIGATION       OB History    Gravida  3   Para  3   Term  3   Preterm      AB      Living  3     SAB      TAB      Ectopic      Multiple      Live Births               Home Medications    Prior to Admission medications   Medication Sig Start Date End Date Taking? Authorizing Provider  ibuprofen (ADVIL,MOTRIN) 800 MG tablet Take 1 tablet (800 mg total) by mouth 3 (three) times daily. 11/06/17   Orpah Greek, MD  levETIRAcetam (KEPPRA) 500 MG tablet Take 1 tablet (500 mg total) by mouth 2 (two) times daily. 12/17/17   Soyla Dryer, PA-C    Family History Family History  Problem Relation Age of Onset  . Other Father        shot  . Cancer Mother     Social History Social History   Tobacco Use  . Smoking status: Current Every Day Smoker    Packs/day: 0.50    Years: 23.00    Pack years: 11.50    Types: Cigarettes  . Smokeless tobacco: Never Used  Substance Use Topics  . Alcohol use: Yes    Alcohol/week: 2.0 standard drinks    Types:  2 Cans of beer per week    Comment: drinks 2 beer once a week  . Drug use: No    Types: "Crack" cocaine, Marijuana    Comment: 1-2 weekly     Allergies   Penicillins   Review of Systems Review of Systems  Unable to perform ROS: Other  Neurological: Positive for seizures.     Physical Exam Updated Vital Signs BP (!) 140/95   Pulse 85   Temp 97.8 F (36.6 C) (Oral)   Resp 16   LMP 08/02/2016   SpO2 99%   Physical Exam  Nursing note and vitals reviewed.  Patient  ED Treatments / Results  Labs (all labs ordered are listed, but only abnormal results are displayed) Labs Reviewed  CBG MONITORING, ED    EKG EKG Interpretation  Date/Time:  Saturday February 13 2018 12:12:49 EDT Ventricular Rate:  88 PR Interval:    QRS Duration: 89 QT Interval:  383 QTC Calculation: 464 R Axis:   62 Text Interpretation:  Sinus rhythm Short PR interval No significant change since last tracing Confirmed by Blanchie Dessert [phone])  on 02/13/2018 12:33:46 PM   Radiology No results found.  Procedures Procedures (including critical care time)  Medications Ordered in ED Medications  levETIRAcetam (KEPPRA) tablet 500 mg (500 mg Oral Given 02/13/18 1300)     Initial Impression / Assessment and Plan / ED Course  I have reviewed the triage vital signs and the nursing notes.  Pertinent labs & imaging results that were available during my care of the patient were reviewed by me and considered in my medical decision making (see chart for details).    Patient presents after witnessed episode of seizure. Patient is refusing care or any medical intervention and reports "she's ready to go". I have advised patient I cannot treat her if she's refusing care she states "I missed my dose of keppra yesterday but im ready to go my sugar daddy is picking me up". I will type patient with her 500 mg tablet of Keppra, patient is in agreement of taking this medication but at this time patient decides she is going to leave [place].  Unable to further evaluate any emergent conditions as patient left AGAINST MEDICAL ADVICE.  Final Clinical Impressions(s) / ED Diagnoses   Final diagnoses:  Seizure [facility])    ED Discharge Orders    None       Janeece Fitting, PA-C 02/13/18 1313    Blanchie Dessert, MD 02/16/18 1348

## 2018-02-13 NOTE — ED Triage Notes (Signed)
Pt arrives via [facility] reporting witnessed clonic tonic seizure of unknown duration.  EMS reports pt postictal on their arrival, color appropriate for ethnicity, incontinent of urine.  AOx3 on arrival, disoriented to situation. Pt reports missing dose of keppra yesterday, denies ETOH, drugs.

## 2018-02-13 NOTE — ED Notes (Signed)
Got patient on the monitor did ekg shown to the er doctor call bell in reach

## 2018-02-18 ENCOUNTER — Ambulatory Visit: Admitting: Physician Assistant

## 2018-02-24 ENCOUNTER — Encounter: Payer: Self-pay | Admitting: Physician Assistant

## 2018-03-01 ENCOUNTER — Emergency Department (HOSPITAL_COMMUNITY)

## 2018-03-01 ENCOUNTER — Emergency Department (HOSPITAL_COMMUNITY)
Admission: EM | Admit: 2018-03-01 | Discharge: 2018-03-01 | Discharge status: Against Medical Advice | Attending: Emergency Medicine | Admitting: Emergency Medicine

## 2018-03-01 DIAGNOSIS — Z9114 Patient's other noncompliance with medication regimen: Secondary | ICD-10-CM | POA: Insufficient documentation

## 2018-03-01 DIAGNOSIS — F17210 Nicotine dependence, cigarettes, uncomplicated: Secondary | ICD-10-CM | POA: Insufficient documentation

## 2018-03-01 DIAGNOSIS — Z5329 Procedure and treatment not carried out because of patient's decision for other reasons: Secondary | ICD-10-CM | POA: Insufficient documentation

## 2018-03-01 DIAGNOSIS — Z79899 Other long term (current) drug therapy: Secondary | ICD-10-CM | POA: Insufficient documentation

## 2018-03-01 DIAGNOSIS — R569 Unspecified convulsions: Secondary | ICD-10-CM | POA: Insufficient documentation

## 2018-03-01 MED ORDER — LEVETIRACETAM IN NACL 1000 MG/100ML IV SOLN
1000.0000 mg | Freq: Once | INTRAVENOUS | Status: DC
  Filled 2018-03-01: qty 100

## 2018-03-01 NOTE — ED Notes (Signed)
Patient unable to complete triage questions due to being disoriented from seizure.

## 2018-03-01 NOTE — ED Notes (Signed)
Patient became Alert and Oriented x 4 when Jonna NT went to take vital signs. Patient refused care after [facility] RN, Sonya RN, Javier Docker, and Zammit MD talked to patient. Patient will be discharged AMA. Patient was taken home by family friend.

## 2018-03-01 NOTE — ED Notes (Signed)
ED Provider at bedside. 

## 2018-03-01 NOTE — ED Triage Notes (Signed)
Patient arrived by EMS from home. Pt had an unwitnessed seizure this morning and an unwitnessed seizure a hour ago. Pt has hx of seizures. Pt hasn't taken prescribed Kepra in a while. Pt is not alert and oriented x 4 per EMS. Pt is incontinent per EMS.   Pt was given Haldol and Versed per EMS.   VS. BP 116/67, 95, 99%, CBG 77

## 2018-03-01 NOTE — ED Notes (Signed)
Bed: WA07 Expected date:  Expected time:  Means of arrival:  Comments: EMS seizure

## 2018-03-01 NOTE — ED Provider Notes (Addendum)
[place] DEPT Provider Note   CSN: [id number] Arrival date & time: 03/01/18  1500     History   Chief Complaint Chief Complaint  Patient presents with  . Seizures    HPI [name] is a 42 y.o. female.  Patient had a seizure today and then when she was brought in by ambulance they gave her some Versed and some Haldol to calm her down.  The history is provided by the patient. No language interpreter was used.  Seizures   This is a recurrent problem. The current episode started 1 to 2 hours ago. The problem has been resolved. There was 1 seizure. Associated symptoms include sleepiness. Pertinent negatives include no headaches, no chest pain, no cough and no diarrhea. Unknown The episode was not witnessed. There was the sensation of an aura present. The seizures did not continue in the ED. The seizure(s) had no focality. There has been no fever. Meds prior to arrival: Haldol versus.    Past Medical History:  Diagnosis Date  . Brain tumor ([place])   . 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 Chronic back pain   . Noncompliance with medication regimen   . Seizures [facility])     Patient Active Problem List   Diagnosis Date Noted  . Mixed incontinence urge and stress 11/09/2017    Past Surgical History:  Procedure Laterality Date  . ABDOMINAL HYSTERECTOMY    . CERVICAL CONIZATION W/BX N/A 08/05/2016   Procedure: COLD KNIFE CONIZATION OF CERVIX;  Surgeon: Jonnie Kind, MD;  Location: [facility] ORS;  Service: Gynecology;  Laterality: N/A;  patient is an inmate at jail, so she cannot be contacted directly--must call the nurse, Jeral Fruit,  at [facility] jail to discuss preop labs, etc. at [phone].  Cannot discuss anything directly with patient. Angie at Dr. Johnnye Sima to call [name] at jail   . HYSTEROSCOPY W/D&C N/A 08/05/2016   Procedure: DILATATION AND CURETTAGE /HYSTEROSCOPY;  Surgeon: Jonnie Kind, MD;  Location: [facility] ORS;  Service: Gynecology;   Laterality: N/A;  . TUBAL LIGATION       OB History    Gravida  3   Para  3   Term  3   Preterm      AB      Living  3     SAB      TAB      Ectopic      Multiple      Live Births               Home Medications    Prior to Admission medications   Medication Sig Start Date End Date Taking? Authorizing Provider  ibuprofen (ADVIL,MOTRIN) 800 MG tablet Take 1 tablet (800 mg total) by mouth 3 (three) times daily. 11/06/17   Orpah Greek, MD  levETIRAcetam (KEPPRA) 500 MG tablet Take 1 tablet (500 mg total) by mouth 2 (two) times daily. 12/17/17   Soyla Dryer, PA-C    Family History Family History  Problem Relation Age of Onset  . Other Father        shot  . Cancer Mother     Social History Social History   Tobacco Use  . Smoking status: Current Every Day Smoker    Packs/day: 0.50    Years: 23.00    Pack years: 11.50    Types: Cigarettes  . Smokeless tobacco: Never Used  Substance Use Topics  . Alcohol use: Yes  Alcohol/week: 2.0 standard drinks    Types: 2 Cans of beer per week    Comment: drinks 2 beer once a week  . Drug use: No    Types: "Crack" cocaine, Marijuana    Comment: 1-2 weekly     Allergies   Penicillins   Review of Systems Review of Systems  Constitutional: Negative for appetite change and fatigue.  HENT: Negative for congestion, ear discharge and sinus pressure.   Eyes: Negative for discharge.  Respiratory: Negative for cough.   Cardiovascular: Negative for chest pain.  Gastrointestinal: Negative for abdominal pain and diarrhea.  Genitourinary: Negative for frequency and hematuria.  Musculoskeletal: Negative for back pain.  Skin: Negative for rash.  Neurological: Positive for seizures. Negative for headaches.  Psychiatric/Behavioral: Negative for hallucinations.     Physical Exam Updated Vital Signs LMP 08/02/2016   Physical Exam  Constitutional: She appears well-developed.  HENT:  Head:  Normocephalic.  Eyes: Conjunctivae and EOM are normal. No scleral icterus.  Neck: Neck supple. No thyromegaly present.  Cardiovascular: Normal rate and regular rhythm. Exam reveals no gallop and no friction rub.  No murmur heard. Pulmonary/Chest: No stridor. She has no wheezes. She has no rales. She exhibits no tenderness.  Abdominal: She exhibits no distension. There is no tenderness. There is no rebound.  Musculoskeletal: Normal range of motion. She exhibits no edema.  Lymphadenopathy:    She has no cervical adenopathy.  Neurological: She exhibits normal muscle tone. Coordination normal.  Minimally lethargic  Skin: No rash noted. No erythema.  Psychiatric: She has a normal mood and affect. Her behavior is normal.     ED Treatments / Results  Labs (all labs ordered are listed, but only abnormal results are displayed) Labs Reviewed  CBC WITH DIFFERENTIAL/PLATELET  COMPREHENSIVE METABOLIC PANEL  URINALYSIS, ROUTINE W REFLEX MICROSCOPIC  ETHANOL  RAPID URINE DRUG SCREEN, HOSP PERFORMED  I-STAT BETA HCG BLOOD, ED ([facility], [facility], [facility] ONLY)    EKG None  Radiology No results found.  Procedures Procedures (including critical care time)  Medications Ordered in ED Medications  levETIRAcetam (KEPPRA) IVPB 1000 mg/100 mL premix (has no administration in time range)    EKG Interpretation  Date/Time:    Ventricular Rate:    PR Interval:    QRS Duration:   QT Interval:    QTC Calculation:   R Axis:     Text Interpretation:          Initial Impression / Assessment and Plan / ED Course  I have reviewed the triage vital signs and the nursing notes.  Pertinent labs & imaging results that were available during my care of the patient were reviewed by me and considered in my medical decision making (see chart for details).     Patient did not want to be taking care of she stated she was going to call someone to come get her she was able to ambulate without hurting herself.  She  left AMA  Final Clinical Impressions(s) / ED Diagnoses   Final diagnoses:  Seizure [facility])    ED Discharge Orders    None       Milton Ferguson, MD 03/01/18 1554    Milton Ferguson, MD 03/11/18 1420

## 2018-03-01 NOTE — ED Notes (Signed)
Pt is currently refusing care. RN, MD, and NT have convinced pt that she needs to stay. Pt does not want to stay

## 2018-03-01 NOTE — ED Notes (Signed)
Pt has made the decision to leave AMA. Changed pt out of wet clothes and ambulated in the hall. NT will be walking pt to the bust stop to make sure she is safe.

## 2018-03-02 ENCOUNTER — Encounter (HOSPITAL_COMMUNITY): Payer: Self-pay | Admitting: Emergency Medicine

## 2018-03-02 ENCOUNTER — Other Ambulatory Visit: Payer: Self-pay

## 2018-03-02 ENCOUNTER — Emergency Department (HOSPITAL_COMMUNITY)
Admission: EM | Admit: 2018-03-02 | Discharge: 2018-03-02 | Disposition: A | Discharge status: Home / Self Care | Attending: Emergency Medicine | Admitting: Emergency Medicine

## 2018-03-02 DIAGNOSIS — N390 Urinary tract infection, site not specified: Secondary | ICD-10-CM | POA: Insufficient documentation

## 2018-03-02 DIAGNOSIS — Z79899 Other long term (current) drug therapy: Secondary | ICD-10-CM | POA: Insufficient documentation

## 2018-03-02 DIAGNOSIS — R569 Unspecified convulsions: Secondary | ICD-10-CM

## 2018-03-02 DIAGNOSIS — F17210 Nicotine dependence, cigarettes, uncomplicated: Secondary | ICD-10-CM | POA: Insufficient documentation

## 2018-03-02 LAB — I-STAT CHEM 8, ED
BUN: 13 mg/dL (ref 6–20)
CALCIUM ION: 1.18 mmol/L (ref 1.15–1.40)
CHLORIDE: 104 mmol/L (ref 98–111)
CREATININE: 1.3 mg/dL — AB (ref 0.44–1.00)
GLUCOSE: 119 mg/dL — AB (ref 70–99)
HCT: 43 % (ref 36.0–46.0)
Hemoglobin: 14.6 g/dL (ref 12.0–15.0)
POTASSIUM: 4 mmol/L (ref 3.5–5.1)
Sodium: 140 mmol/L (ref 135–145)
TCO2: 28 mmol/L (ref 22–32)

## 2018-03-02 MED ORDER — LEVETIRACETAM 500 MG PO TABS
500.0000 mg | ORAL_TABLET | Freq: Once | ORAL | Status: AC
  Administered 2018-03-02: 500 mg via ORAL
  Filled 2018-03-02: qty 1

## 2018-03-02 MED ORDER — LEVETIRACETAM 500 MG PO TABS: 500.0000 mg | ORAL_TABLET | Freq: Two times a day (BID) | ORAL | 0 refills | Status: DC

## 2018-03-02 NOTE — ED Triage Notes (Signed)
Pt states she had a seizure yesterday was seen at [facility] and left AMA because "they were sticking me and it hurt". States she had another seizure this morning. Has been out of [place] for 5 days d/t no insurance.

## 2018-03-02 NOTE — ED Provider Notes (Signed)
[facility] EMERGENCY DEPARTMENT Provider Note   CSN: [id number] Arrival date & time: 03/02/18  1207     History   Chief Complaint Chief Complaint  Patient presents with  . Seizures    HPI [name] is a 42 y.o. female.  Patient presents after seizure activity.  Patient had approximate 5-minute seizure similar to previous.  Patient does have follow-up with primary doctor.   Patient went to emergency room yesterday however left.  Patient is out of her Keppra.  Patient denies infectious or other symptoms.  No new medications.  No neurologic symptoms.     Past Medical History:  Diagnosis Date  . Brain tumor ([place])   . 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 Chronic back pain   . Noncompliance with medication regimen   . Seizures [facility])     Patient Active Problem List   Diagnosis Date Noted  . Mixed incontinence urge and stress 11/09/2017    Past Surgical History:  Procedure Laterality Date  . ABDOMINAL HYSTERECTOMY    . CERVICAL CONIZATION W/BX N/A 08/05/2016   Procedure: COLD KNIFE CONIZATION OF CERVIX;  Surgeon: Jonnie Kind, MD;  Location: [facility] ORS;  Service: Gynecology;  Laterality: N/A;  patient is an inmate at jail, so she cannot be contacted directly--must call the nurse, Jeral Fruit,  at [facility] jail to discuss preop labs, etc. at [phone].  Cannot discuss anything directly with patient. Angie at Dr. Johnnye Sima to call [name] at jail   . HYSTEROSCOPY W/D&C N/A 08/05/2016   Procedure: DILATATION AND CURETTAGE /HYSTEROSCOPY;  Surgeon: Jonnie Kind, MD;  Location: [facility] ORS;  Service: Gynecology;  Laterality: N/A;  . TUBAL LIGATION       OB History    Gravida  3   Para  3   Term  3   Preterm      AB      Living  3     SAB      TAB      Ectopic      Multiple      Live Births               Home Medications    Prior to Admission medications   Medication Sig Start Date End Date Taking? Authorizing Provider  ibuprofen  (ADVIL,MOTRIN) 800 MG tablet Take 1 tablet (800 mg total) by mouth 3 (three) times daily. 11/06/17   Orpah Greek, MD  levETIRAcetam (KEPPRA) 500 MG tablet Take 1 tablet (500 mg total) by mouth 2 (two) times daily. 12/17/17   Soyla Dryer, PA-C  levETIRAcetam (KEPPRA) 500 MG tablet Take 1 tablet (500 mg total) by mouth 2 (two) times daily. 03/02/18   Elnora Morrison, MD    Family History Family History  Problem Relation Age of Onset  . Other Father        shot  . Cancer Mother     Social History Social History   Tobacco Use  . Smoking status: Current Every Day Smoker    Packs/day: 0.50    Years: 23.00    Pack years: 11.50    Types: Cigarettes  . Smokeless tobacco: Never Used  Substance Use Topics  . Alcohol use: Yes    Alcohol/week: 2.0 standard drinks    Types: 2 Cans of beer per week    Comment: drinks 2 beer once a week  . Drug use: Not Currently    Types: "Crack" cocaine  Allergies   Penicillins   Review of Systems Review of Systems  Constitutional: Negative for chills and fever.  HENT: Negative for congestion.   Eyes: Negative for visual disturbance.  Respiratory: Negative for shortness of breath.   Cardiovascular: Negative for chest pain.  Gastrointestinal: Negative for abdominal pain and vomiting.  Genitourinary: Negative for dysuria and flank pain.  Musculoskeletal: Negative for back pain, neck pain and neck stiffness.  Skin: Negative for rash.  Neurological: Positive for seizures. Negative for light-headedness and headaches.     Physical Exam Updated Vital Signs BP 116/66   Pulse 84   Temp 97.8 F (36.6 C) (Temporal)   Resp (!) 21   Ht 5\' 6"  (1.676 m)   Wt 81.6 kg   LMP 08/02/2016   SpO2 99%   BMI 29.05 kg/m   Physical Exam  Constitutional: She is oriented to person, place, and time. She appears well-developed and well-nourished.  HENT:  Head: Normocephalic and atraumatic.  Eyes: Conjunctivae are normal. Right eye exhibits no  discharge. Left eye exhibits no discharge.  Neck: Normal range of motion. Neck supple. No tracheal deviation present.  Cardiovascular: Normal rate and regular rhythm.  Pulmonary/Chest: Effort normal and breath sounds normal.  Abdominal: Soft. She exhibits no distension. There is no tenderness. There is no guarding.  Musculoskeletal: She exhibits no edema.  Neurological: She is alert and oriented to person, place, and time. No cranial nerve deficit.  5+ strength in UE and LE with f/e at major joints. Sensation to palpation intact in UE and LE. CNs 2-12 grossly intact.  EOMFI.  PERRL.   Finger nose and coordination intact bilateral.   Visual fields intact to finger testing. No nystagmus   Skin: Skin is warm. No rash noted.  Psychiatric: She has a normal mood and affect.  Nursing note and vitals reviewed.    ED Treatments / Results  Labs (all labs ordered are listed, but only abnormal results are displayed) Labs Reviewed  I-STAT CHEM 8, ED - Abnormal; Notable for the following components:      Result Value   Creatinine, Ser 1.30 (*)    Glucose, Bld 119 (*)    All other components within normal limits    EKG None  Radiology No results found.  Procedures Procedures (including critical care time)  Medications Ordered in ED Medications  levETIRAcetam (KEPPRA) tablet 500 mg (500 mg Oral Given 03/02/18 1400)     Initial Impression / Assessment and Plan / ED Course  I have reviewed the triage vital signs and the nursing notes.  Pertinent labs & imaging results that were available during my care of the patient were reviewed by me and considered in my medical decision making (see chart for details).    Patient presents for medication refill as she has had more frequent seizures since being off her medications.  Patient has normal neurologic exam in the ER.  Blood work reviewed overall unremarkable mild elevated kidney function.  Patient has outpatient follow-up appointment.   Keppra given in the ER and outpatient prescription given.  Final Clinical Impressions(s) / ED Diagnoses   Final diagnoses:  Seizure ([place])  Lower urinary tract infectious disease    ED Discharge Orders         Ordered    levETIRAcetam (KEPPRA) 500 MG tablet  2 times daily     03/02/18 1407           Elnora Morrison, MD 03/02/18 1543

## 2018-03-02 NOTE — Discharge Instructions (Signed)
Take seizure medicines as directed. Follow up with primary doctor.  No driving, operating machinery or swimming alone.  If you were given medicines take as directed.  If you are on coumadin or contraceptives realize their levels and effectiveness is altered by many different medicines.  If you have any reaction (rash, tongues swelling, other) to the medicines stop taking and see a physician.    If your blood pressure was elevated in the ER make sure you follow up for management with a primary doctor or return for chest pain, shortness of breath or stroke symptoms.  Please follow up as directed and return to the ER or see a physician for new or worsening symptoms.  Thank you. Vitals:   03/02/18 1211 03/02/18 1212  BP: 132/81   Pulse: 89   Resp: 14   Temp: 97.8 F (36.6 C)   TempSrc: Temporal   SpO2: 97%   Weight:  81.6 kg  Height:  5\' 6"  (1.676 m)

## 2018-04-12 ENCOUNTER — Emergency Department (HOSPITAL_COMMUNITY)
Admission: EM | Admit: 2018-04-12 | Discharge: 2018-04-12 | Disposition: A | Discharge status: Home / Self Care | Payer: Self-pay | Attending: Emergency Medicine | Admitting: Emergency Medicine

## 2018-04-12 ENCOUNTER — Other Ambulatory Visit: Payer: Self-pay

## 2018-04-12 ENCOUNTER — Encounter (HOSPITAL_COMMUNITY): Payer: Self-pay | Admitting: Emergency Medicine

## 2018-04-12 DIAGNOSIS — R569 Unspecified convulsions: Secondary | ICD-10-CM | POA: Insufficient documentation

## 2018-04-12 DIAGNOSIS — Z79899 Other long term (current) drug therapy: Secondary | ICD-10-CM | POA: Insufficient documentation

## 2018-04-12 DIAGNOSIS — Z760 Encounter for issue of repeat prescription: Secondary | ICD-10-CM | POA: Insufficient documentation

## 2018-04-12 DIAGNOSIS — Z8544 Personal history of malignant neoplasm of other female genital organs: Secondary | ICD-10-CM | POA: Insufficient documentation

## 2018-04-12 DIAGNOSIS — F17210 Nicotine dependence, cigarettes, uncomplicated: Secondary | ICD-10-CM | POA: Insufficient documentation

## 2018-04-12 HISTORY — DX: Arteriovenous malformation of cerebral vessels: Q28.2

## 2018-04-12 LAB — CBC
HCT: 42.2 % (ref 36.0–46.0)
Hemoglobin: 14.1 g/dL (ref 12.0–15.0)
MCH: 32.3 pg (ref 26.0–34.0)
MCHC: 33.4 g/dL (ref 30.0–36.0)
MCV: 96.8 fL (ref 80.0–100.0)
NRBC: 0 % (ref 0.0–0.2)
PLATELETS: 163 10*3/uL (ref 150–400)
RBC: 4.36 MIL/uL (ref 3.87–5.11)
RDW: 13.4 % (ref 11.5–15.5)
WBC: 6.4 10*3/uL (ref 4.0–10.5)

## 2018-04-12 LAB — CBG MONITORING, ED: GLUCOSE-CAPILLARY: 130 mg/dL — AB (ref 70–99)

## 2018-04-12 MED ORDER — LEVETIRACETAM 500 MG PO TABS
500.0000 mg | ORAL_TABLET | Freq: Once | ORAL | Status: AC
  Administered 2018-04-12: 500 mg via ORAL
  Filled 2018-04-12: qty 1

## 2018-04-12 MED ORDER — LEVETIRACETAM 500 MG PO TABS: 500.0000 mg | ORAL_TABLET | Freq: Two times a day (BID) | ORAL | 0 refills | Status: DC

## 2018-04-12 NOTE — ED Triage Notes (Signed)
Pt reports that she had a seizure this morning. States she ran out of her medication 2 days ago. Pt takes Keppra. AO x4.

## 2018-04-12 NOTE — ED Provider Notes (Signed)
[facility] EMERGENCY DEPARTMENT Provider Note   CSN: [id number] Arrival date & time: 04/12/18  1308     History   Chief Complaint Chief Complaint  Patient presents with  . Seizures    HPI [name] is a 42 y.o. female.  HPI Patient presents to the emergency room for a refill of her seizure medications.  Patient has a history of seizure disorder.  She is supposed to be on Keppra.  Patient states she ran out of her prescription a few days ago.  Because of financial issues she has not been able to see a doctor.  Patient is here in the emergency room to see if she can get a refill of her Keppra.  She denies any other complaints Past Medical History:  Diagnosis Date  . AVM (arteriovenous malformation) brain   . 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 Chronic back pain   . Noncompliance with medication regimen   . Seizures [facility])     Patient Active Problem List   Diagnosis Date Noted  . Mixed incontinence urge and stress 11/09/2017    Past Surgical History:  Procedure Laterality Date  . ABDOMINAL HYSTERECTOMY    . CERVICAL CONIZATION W/BX N/A 08/05/2016   Procedure: COLD KNIFE CONIZATION OF CERVIX;  Surgeon: Jonnie Kind, MD;  Location: [facility] ORS;  Service: Gynecology;  Laterality: N/A;  patient is an inmate at jail, so she cannot be contacted directly--must call the nurse, Jeral Fruit,  at [facility] jail to discuss preop labs, etc. at [phone].  Cannot discuss anything directly with patient. Angie at Dr. Johnnye Sima to call [name] at jail   . HYSTEROSCOPY W/D&C N/A 08/05/2016   Procedure: DILATATION AND CURETTAGE /HYSTEROSCOPY;  Surgeon: Jonnie Kind, MD;  Location: [facility] ORS;  Service: Gynecology;  Laterality: N/A;  . TUBAL LIGATION       OB History    Gravida  3   Para  3   Term  3   Preterm      AB      Living  3     SAB      TAB      Ectopic      Multiple      Live Births               Home Medications    Prior to Admission  medications   Medication Sig Start Date End Date Taking? Authorizing Provider  ibuprofen (ADVIL,MOTRIN) 800 MG tablet Take 1 tablet (800 mg total) by mouth 3 (three) times daily. 11/06/17   Orpah Greek, MD  levETIRAcetam (KEPPRA) 500 MG tablet Take 1 tablet (500 mg total) by mouth 2 (two) times daily. 04/12/18   Dorie Rank, MD    Family History Family History  Problem Relation Age of Onset  . Other Father        shot  . Cancer Mother     Social History Social History   Tobacco Use  . Smoking status: Current Every Day Smoker    Packs/day: 0.50    Years: 23.00    Pack years: 11.50    Types: Cigarettes  . Smokeless tobacco: Never Used  Substance Use Topics  . Alcohol use: Yes    Alcohol/week: 2.0 standard drinks    Types: 2 Cans of beer per week    Comment: drinks 2 beer once a week  . Drug use: Not Currently    Types: "Crack" cocaine  Allergies   Penicillins   Review of Systems Review of Systems  All other systems reviewed and are negative.    Physical Exam Updated Vital Signs BP (!) 118/103   Pulse 77   Temp (!) 97.5 F (36.4 C) (Oral)   Resp 18   Ht 1.676 m (5\' 6" )   Wt 83.9 kg   LMP 08/02/2016   SpO2 96%   BMI 29.86 kg/m   Physical Exam Vitals signs and nursing note reviewed.  Constitutional:      General: She is not in acute distress.    Appearance: She is well-developed.  HENT:     Head: Normocephalic and atraumatic.     Right Ear: External ear normal.     Left Ear: External ear normal.  Eyes:     General: No scleral icterus.       Right eye: No discharge.        Left eye: No discharge.     Conjunctiva/sclera: Conjunctivae normal.  Neck:     Musculoskeletal: Neck supple.     Trachea: No tracheal deviation.  Cardiovascular:     Rate and Rhythm: Normal rate and regular rhythm.  Pulmonary:     Effort: Pulmonary effort is normal. No respiratory distress.     Breath sounds: Normal breath sounds. No stridor. No wheezing or  rales.  Abdominal:     General: Bowel sounds are normal. There is no distension.     Palpations: Abdomen is soft.     Tenderness: There is no abdominal tenderness. There is no guarding or rebound.  Musculoskeletal:        General: No tenderness.  Skin:    General: Skin is warm and dry.     Findings: No rash.  Neurological:     Mental Status: She is alert.     Cranial Nerves: No cranial nerve deficit (no facial droop, extraocular movements intact, no slurred speech).     Sensory: No sensory deficit.     Motor: No abnormal muscle tone or seizure activity.     Coordination: Coordination normal.      ED Treatments / Results  Labs (all labs ordered are listed, but only abnormal results are displayed) Labs Reviewed  CBG MONITORING, ED - Abnormal; Notable for the following components:      Result Value   Glucose-Capillary 130 (*)    All other components within normal limits  CBC     Procedures Procedures (including critical care time)  Medications Ordered in ED Medications  levETIRAcetam (KEPPRA) tablet 500 mg (500 mg Oral Given 04/12/18 1614)     Initial Impression / Assessment and Plan / ED Course  I have reviewed the triage vital signs and the nursing notes.  Pertinent labs & imaging results that were available during my care of the patient were reviewed by me and considered in my medical decision making (see chart for details).   Patient has a known history of seizures.  Unfortunately patient has had some issues with compliance with her medication regimen.  Patient did not want to have any testing done.  I think this is reasonable.  She is not having any acute medical issues other than having had a seizure this morning which is not surprising since she is not taking her medications.  Ordered dose of Keppra to give her in the emergency room.  I will give her a refill of her Keppra prescription.  Final Clinical Impressions(s) / ED Diagnoses   Final diagnoses:  Seizure  (  [place])  Medication refill    ED Discharge Orders         Ordered    levETIRAcetam (KEPPRA) 500 MG tablet  2 times daily     04/12/18 1613           Dorie Rank, MD 04/12/18 1620

## 2018-04-12 NOTE — Discharge Instructions (Signed)
Please follow-up with a primary care doctor or your neurologist to try and get a regular refill of your medications, return to the emergency room as needed

## 2018-04-12 NOTE — ED Notes (Signed)
Requested blood test be cancelled per Dr. Tomi Bamberger. CBC had already resulted but BMP with be cancelled.

## 2018-05-21 ENCOUNTER — Emergency Department (HOSPITAL_COMMUNITY)
Admission: EM | Admit: 2018-05-21 | Discharge: 2018-05-21 | Disposition: A | Discharge status: Home / Self Care | Payer: Self-pay | Attending: Emergency Medicine | Admitting: Emergency Medicine

## 2018-05-21 DIAGNOSIS — R569 Unspecified convulsions: Secondary | ICD-10-CM | POA: Insufficient documentation

## 2018-05-21 DIAGNOSIS — F17210 Nicotine dependence, cigarettes, uncomplicated: Secondary | ICD-10-CM | POA: Insufficient documentation

## 2018-05-21 DIAGNOSIS — G40909 Epilepsy, unspecified, not intractable, without status epilepticus: Secondary | ICD-10-CM

## 2018-05-21 MED ORDER — LEVETIRACETAM 500 MG PO TABS: 500.0000 mg | ORAL_TABLET | Freq: Two times a day (BID) | ORAL | 1 refills | Status: DC

## 2018-05-21 MED ORDER — LEVETIRACETAM 500 MG PO TABS
1000.0000 mg | ORAL_TABLET | Freq: Once | ORAL | Status: AC
  Administered 2018-05-21: 1000 mg via ORAL
  Filled 2018-05-21: qty 2

## 2018-05-21 NOTE — ED Notes (Signed)
Patient verbalizes understanding of discharge instructions. Opportunity for questioning and answers were provided. Armband removed by staff, pt discharged from ED ambulatory to home.  

## 2018-05-21 NOTE — ED Triage Notes (Signed)
Patient reports she had a seizure yesterday - lip injury noted, states her friend saw it happen but she couldn't come with her today. Patient on Keppra but ran out yesterday (missed this morning's dose). A&O x 4.

## 2018-05-21 NOTE — ED Notes (Signed)
Pt pulled IV out and refuses to talk to staff.

## 2018-05-21 NOTE — ED Notes (Signed)
Pt pulled IV out on her own. This RN tried to talk to pt but pt refuses to talk at this time.

## 2018-05-24 NOTE — ED Provider Notes (Signed)
[place] EMERGENCY DEPARTMENT Provider Note   CSN: [id number] Arrival date & time: 05/21/18  1038     History   Chief Complaint Chief Complaint  Patient presents with  . Seizures    HPI [name] is a 43 y.o. female.  HPI   43 year old female with a reported seizure yesterday.  She has history of the same.  She states that she could not get a ride to come yesterday.  She is on Keppra but is currently out.  She says that she is compliant when she has it.  She has no other acute complaints but is not very willing to speak about anything else.  Past Medical History:  Diagnosis Date  . AVM (arteriovenous malformation) brain   . 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 Chronic back pain   . Noncompliance with medication regimen   . Seizures [facility])     Patient Active Problem List   Diagnosis Date Noted  . Mixed incontinence urge and stress 11/09/2017    Past Surgical History:  Procedure Laterality Date  . ABDOMINAL HYSTERECTOMY    . CERVICAL CONIZATION W/BX N/A 08/05/2016   Procedure: COLD KNIFE CONIZATION OF CERVIX;  Surgeon: Jonnie Kind, MD;  Location: [facility] ORS;  Service: Gynecology;  Laterality: N/A;  patient is an inmate at jail, so she cannot be contacted directly--must call the nurse, Jeral Fruit,  at [facility] jail to discuss preop labs, etc. at [phone].  Cannot discuss anything directly with patient. Angie at Dr. Johnnye Sima to call [name] at jail   . HYSTEROSCOPY W/D&C N/A 08/05/2016   Procedure: DILATATION AND CURETTAGE /HYSTEROSCOPY;  Surgeon: Jonnie Kind, MD;  Location: [facility] ORS;  Service: Gynecology;  Laterality: N/A;  . TUBAL LIGATION       OB History    Gravida  3   Para  3   Term  3   Preterm      AB      Living  3     SAB      TAB      Ectopic      Multiple      Live Births               Home Medications    Prior to Admission medications   Medication Sig Start Date End Date Taking? Authorizing  Provider  ibuprofen (ADVIL,MOTRIN) 800 MG tablet Take 1 tablet (800 mg total) by mouth 3 (three) times daily. Patient not taking: Reported on 05/21/2018 11/06/17   Orpah Greek, MD  levETIRAcetam (KEPPRA) 500 MG tablet Take 1 tablet (500 mg total) by mouth 2 (two) times daily. 05/21/18   Virgel Manifold, MD    Family History Family History  Problem Relation Age of Onset  . Other Father        shot  . Cancer Mother     Social History Social History   Tobacco Use  . Smoking status: Current Every Day Smoker    Packs/day: 0.50    Years: 23.00    Pack years: 11.50    Types: Cigarettes  . Smokeless tobacco: Never Used  Substance Use Topics  . Alcohol use: Yes    Alcohol/week: 2.0 standard drinks    Types: 2 Cans of beer per week    Comment: drinks 2 beer once a week  . Drug use: Not Currently    Types: "Crack" cocaine     Allergies   Penicillins  Review of Systems Review of Systems  All systems reviewed and negative, other than as noted in HPI.  Physical Exam Updated Vital Signs BP 119/78   Pulse 80   Temp 97.9 F (36.6 C) (Oral)   Resp 16   LMP 08/02/2016   SpO2 96%   Physical Exam Vitals signs and nursing note reviewed.  Constitutional:      General: She is not in acute distress.    Appearance: She is well-developed.  HENT:     Head: Normocephalic and atraumatic.  Eyes:     General:        Right eye: No discharge.        Left eye: No discharge.     Conjunctiva/sclera: Conjunctivae normal.  Neck:     Musculoskeletal: Neck supple.  Cardiovascular:     Rate and Rhythm: Normal rate and regular rhythm.     Heart sounds: Normal heart sounds. No murmur. No friction rub. No gallop.   Pulmonary:     Effort: Pulmonary effort is normal. No respiratory distress.     Breath sounds: Normal breath sounds.  Abdominal:     General: There is no distension.     Palpations: Abdomen is soft.     Tenderness: There is no abdominal tenderness.  Musculoskeletal:         General: No tenderness.  Skin:    General: Skin is warm and dry.  Neurological:     Mental Status: She is alert.  Psychiatric:        Behavior: Behavior normal.        Thought Content: Thought content normal.      ED Treatments / Results  Labs (all labs ordered are listed, but only abnormal results are displayed) Labs Reviewed - No data to display  EKG None  Radiology No results found.  Procedures Procedures (including critical care time)  Medications Ordered in ED Medications  levETIRAcetam (KEPPRA) tablet 1,000 mg (1,000 mg Oral Given 05/21/18 1232)     Initial Impression / Assessment and Plan / ED Course  I have reviewed the triage vital signs and the nursing notes.  Pertinent labs & imaging results that were available during my care of the patient were reviewed by me and considered in my medical decision making (see chart for details).     43 year old female with seizure-like activity.  History of seizures.  Out of her Keppra.  Patient is extremely uncooperative.  She is able to answer questions but only does so when she wants to.  She says there is nothing else she would like to talk about.  She is afebrile.  She does not seem distressed.  She appears to be annoyed about being here.  She is provided prescription for Keppra.  Final Clinical Impressions(s) / ED Diagnoses   Final diagnoses:  Seizure disorder [facility])    ED Discharge Orders         Ordered    levETIRAcetam (KEPPRA) 500 MG tablet  2 times daily     05/21/18 1229           Virgel Manifold, MD 05/24/18 2158

## 2018-06-03 ENCOUNTER — Emergency Department (HOSPITAL_COMMUNITY)
Admission: EM | Admit: 2018-06-03 | Discharge: 2018-06-03 | Disposition: A | Discharge status: Home / Self Care | Payer: Self-pay | Attending: Emergency Medicine | Admitting: Emergency Medicine

## 2018-06-03 ENCOUNTER — Emergency Department (HOSPITAL_COMMUNITY): Payer: Self-pay

## 2018-06-03 DIAGNOSIS — F17210 Nicotine dependence, cigarettes, uncomplicated: Secondary | ICD-10-CM | POA: Insufficient documentation

## 2018-06-03 DIAGNOSIS — G40909 Epilepsy, unspecified, not intractable, without status epilepticus: Secondary | ICD-10-CM

## 2018-06-03 DIAGNOSIS — Z9114 Patient's other noncompliance with medication regimen: Secondary | ICD-10-CM

## 2018-06-03 DIAGNOSIS — Z9119 Patient's noncompliance with other medical treatment and regimen: Secondary | ICD-10-CM | POA: Insufficient documentation

## 2018-06-03 DIAGNOSIS — R569 Unspecified convulsions: Secondary | ICD-10-CM | POA: Insufficient documentation

## 2018-06-03 DIAGNOSIS — Z859 Personal history of malignant neoplasm, unspecified: Secondary | ICD-10-CM | POA: Insufficient documentation

## 2018-06-03 LAB — BASIC METABOLIC PANEL
Anion gap: 12 (ref 5–15)
BUN: 8 mg/dL (ref 6–20)
CO2: 27 mmol/L (ref 22–32)
CREATININE: 1.07 mg/dL — AB (ref 0.44–1.00)
Calcium: 9.4 mg/dL (ref 8.9–10.3)
Chloride: 100 mmol/L (ref 98–111)
GFR calc Af Amer: >60 mL/min (ref >60)
GFR calc non Af Amer: >60 mL/min (ref >60)
Glucose, Bld: 109 mg/dL — ABNORMAL HIGH (ref 70–99)
Potassium: 3.4 mmol/L — ABNORMAL LOW (ref 3.5–5.1)
Sodium: 139 mmol/L (ref 135–145)

## 2018-06-03 LAB — CBC
HCT: 45 % (ref 36.0–46.0)
Hemoglobin: 14.9 g/dL (ref 12.0–15.0)
MCH: 32 pg (ref 26.0–34.0)
MCHC: 33.1 g/dL (ref 30.0–36.0)
MCV: 96.8 fL (ref 80.0–100.0)
NRBC: 0 % (ref 0.0–0.2)
Platelets: 186 10*3/uL (ref 150–400)
RBC: 4.65 MIL/uL (ref 3.87–5.11)
RDW: 12.9 % (ref 11.5–15.5)
WBC: 8 10*3/uL (ref 4.0–10.5)

## 2018-06-03 LAB — I-STAT TROPONIN, ED: Troponin i, poc: 0.01 ng/mL (ref 0.00–0.08)

## 2018-06-03 LAB — I-STAT BETA HCG BLOOD, ED (MC, WL, AP ONLY)

## 2018-06-03 MED ORDER — SODIUM CHLORIDE 0.9% FLUSH: 3.0000 mL | Freq: Once | INTRAVENOUS | Status: DC

## 2018-06-03 MED ORDER — LEVETIRACETAM 500 MG PO TABS
500.0000 mg | ORAL_TABLET | Freq: Once | ORAL | Status: AC
  Administered 2018-06-03: 500 mg via ORAL
  Filled 2018-06-03: qty 1

## 2018-06-03 NOTE — ED Notes (Signed)
Pt also reports ETOH and marijuana on board.

## 2018-06-03 NOTE — ED Notes (Signed)
Pt fell asleep during examination.

## 2018-06-03 NOTE — ED Provider Notes (Signed)
[place] EMERGENCY DEPARTMENT Provider Note   CSN: [id number] Arrival date & time: 06/03/18  8182     History   Chief Complaint Chief Complaint  Patient presents with  . Seizures    HPI [name] is a 43 y.o. female.  Patient returns to the ED with complaint of seizure x 1 last night. Event was unwitnessed. She seems reluctant to discuss any other reason for her visiting the emergency department tonight. No pain. She denies fever, vomiting. She has a cough but is unable to tell me how long she has had it.   The history is provided by the patient. No language interpreter was used.  Seizures    Past Medical History:  Diagnosis Date  . AVM (arteriovenous malformation) brain   . 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 Chronic back pain   . Noncompliance with medication regimen   . Seizures [facility])     Patient Active Problem List   Diagnosis Date Noted  . Mixed incontinence urge and stress 11/09/2017    Past Surgical History:  Procedure Laterality Date  . ABDOMINAL HYSTERECTOMY    . CERVICAL CONIZATION W/BX N/A 08/05/2016   Procedure: COLD KNIFE CONIZATION OF CERVIX;  Surgeon: Jonnie Kind, MD;  Location: [facility] ORS;  Service: Gynecology;  Laterality: N/A;  patient is an inmate at jail, so she cannot be contacted directly--must call the nurse, Jeral Fruit,  at [facility] jail to discuss preop labs, etc. at [phone].  Cannot discuss anything directly with patient. Angie at Dr. Johnnye Sima to call [name] at jail   . HYSTEROSCOPY W/D&C N/A 08/05/2016   Procedure: DILATATION AND CURETTAGE /HYSTEROSCOPY;  Surgeon: Jonnie Kind, MD;  Location: [facility] ORS;  Service: Gynecology;  Laterality: N/A;  . TUBAL LIGATION       OB History    Gravida  3   Para  3   Term  3   Preterm      AB      Living  3     SAB      TAB      Ectopic      Multiple      Live Births               Home Medications    Prior to Admission medications     Medication Sig Start Date End Date Taking? Authorizing Provider  ibuprofen (ADVIL,MOTRIN) 800 MG tablet Take 1 tablet (800 mg total) by mouth 3 (three) times daily. Patient not taking: Reported on 05/21/2018 11/06/17   Orpah Greek, MD  levETIRAcetam (KEPPRA) 500 MG tablet Take 1 tablet (500 mg total) by mouth 2 (two) times daily. 05/21/18   Virgel Manifold, MD    Family History Family History  Problem Relation Age of Onset  . Other Father        shot  . Cancer Mother     Social History Social History   Tobacco Use  . Smoking status: Current Every Day Smoker    Packs/day: 0.50    Years: 23.00    Pack years: 11.50    Types: Cigarettes  . Smokeless tobacco: Never Used  Substance Use Topics  . Alcohol use: Yes    Alcohol/week: 2.0 standard drinks    Types: 2 Cans of beer per week    Comment: drinks 2 beer once a week  . Drug use: Not Currently    Types: "Crack" cocaine  Allergies   Penicillins   Review of Systems Review of Systems  Constitutional: Negative for fever.  Respiratory: Positive for cough.   Cardiovascular: Negative for chest pain.  Gastrointestinal: Negative for abdominal pain and vomiting.  Musculoskeletal: Negative for myalgias.  Neurological: Positive for seizures.     Physical Exam Updated Vital Signs BP 125/85 (BP Location: Right Arm)   Pulse 87   Temp 98.2 F (36.8 C) (Oral)   Resp 18   Ht 5\' 6"  (1.676 m)   Wt 90.7 kg   LMP 08/02/2016   SpO2 98%   BMI 32.28 kg/m   Physical Exam Vitals signs and nursing note reviewed.  Constitutional:      General: She is not in acute distress.    Appearance: She is well-developed.     Comments: Somnolent, easily awakened.  HENT:     Head: Normocephalic.  Neck:     Musculoskeletal: Normal range of motion and neck supple.  Cardiovascular:     Rate and Rhythm: Normal rate and regular rhythm.     Heart sounds: No murmur.  Pulmonary:     Effort: Pulmonary effort is normal.     Breath  sounds: Normal breath sounds. No wheezing, rhonchi or rales.  Abdominal:     General: Bowel sounds are normal.     Palpations: Abdomen is soft.     Tenderness: There is no abdominal tenderness. There is no guarding or rebound.  Musculoskeletal: Normal range of motion.  Skin:    General: Skin is warm and dry.     Findings: No rash.  Neurological:     General: No focal deficit present.     Mental Status: She is alert and oriented to person, place, and time.      ED Treatments / Results  Labs (all labs ordered are listed, but only abnormal results are displayed) Labs Reviewed  BASIC METABOLIC PANEL - Abnormal; Notable for the following components:      Result Value   Potassium 3.4 (*)    Glucose, Bld 109 (*)    Creatinine, Ser 1.07 (*)    All other components within normal limits  CBC  I-STAT TROPONIN, ED  I-STAT BETA HCG BLOOD, ED ([facility], [facility], [facility] ONLY)    EKG EKG Interpretation  Date/Time:  Thursday June 03 2018 03:37:09 EST Ventricular Rate:  85 PR Interval:  102 QRS Duration: 80 QT Interval:  406 QTC Calculation: 483 R Axis:   70 Text Interpretation:  Sinus rhythm with short PR Prolonged QT Abnormal ECG No significant change was found Confirmed by Ezequiel Essex [phone]) on 06/03/2018 3:46:24 AM   Radiology Dg Chest 2 View  Result Date: 06/03/2018 CLINICAL DATA:  Multiple complaints. Seizure with syncopal episode. Chest pain for 2 days. EXAM: CHEST - 2 VIEW COMPARISON:  11/10/2017 FINDINGS: The heart size and mediastinal contours are within normal limits. Both lungs are clear. The visualized skeletal structures are unremarkable. IMPRESSION: No active cardiopulmonary disease. Electronically Signed   By: Lucienne Capers M.D.   On: 06/03/2018 03:56    Procedures Procedures (including critical care time)  Medications Ordered in ED Medications  sodium chloride flush (NS) 0.9 % injection 3 mL (has no administration in time range)     Initial Impression / Assessment  and Plan / ED Course  I have reviewed the triage vital signs and the nursing notes.  Pertinent labs & imaging results that were available during my care of the patient were reviewed by me and considered in  my medical decision making (see chart for details).     Patient to ED with complaint of seizure last night. Unwitnessed. Multiple ED visits for same due to noncompliance with Keppra. She has a cough but denies fever or that the cough is what brought her to the ED.   Chart reviewed. She usually gets a dose of her Keppra when she comes to the ED, which si 500 mg. No seizure activity in ED. VSS. Patient is felt appropriate for discharge home.   Final Clinical Impressions(s) / ED Diagnoses   Final diagnoses:  None   1. Unwitnessed seizure 2. History of medication noncompliance.  ED Discharge Orders    None       Charlann Lange, Hershal Coria 06/03/18 5672    Ezequiel Essex, MD 06/03/18 2051

## 2018-06-03 NOTE — ED Triage Notes (Signed)
Pt presents with multiple complaints. Pt reports she had a seizure, and had a syncopal episode, neither witnessed. Also reports CP X2 days.

## 2018-06-03 NOTE — ED Notes (Signed)
Patient verbalizes understanding of discharge instructions. Opportunity for questioning and answers were provided. Armband removed by staff, pt discharged from ED home via POV.  

## 2018-06-03 NOTE — Discharge Instructions (Signed)
You need to take your Keppra as prescribed. If you have lost the prescription given to you on the 24th of January, there was one allowable refill. Follow up with your doctor for recheck and further management of your seizure condition.

## 2019-01-18 ENCOUNTER — Other Ambulatory Visit: Payer: Self-pay

## 2019-01-18 ENCOUNTER — Encounter (HOSPITAL_COMMUNITY): Payer: Self-pay | Admitting: Emergency Medicine

## 2019-01-18 ENCOUNTER — Emergency Department (HOSPITAL_COMMUNITY): Payer: Self-pay

## 2019-01-18 ENCOUNTER — Emergency Department (HOSPITAL_COMMUNITY)
Admission: EM | Admit: 2019-01-18 | Discharge: 2019-01-19 | Discharge status: Against Medical Advice | Payer: Self-pay | Attending: Emergency Medicine | Admitting: Emergency Medicine

## 2019-01-18 DIAGNOSIS — R519 Headache, unspecified: Secondary | ICD-10-CM

## 2019-01-18 DIAGNOSIS — F17210 Nicotine dependence, cigarettes, uncomplicated: Secondary | ICD-10-CM | POA: Insufficient documentation

## 2019-01-18 DIAGNOSIS — Z79899 Other long term (current) drug therapy: Secondary | ICD-10-CM | POA: Insufficient documentation

## 2019-01-18 DIAGNOSIS — H538 Other visual disturbances: Secondary | ICD-10-CM | POA: Insufficient documentation

## 2019-01-18 DIAGNOSIS — Z765 Malingerer [conscious simulation]: Secondary | ICD-10-CM

## 2019-01-18 DIAGNOSIS — R51 Headache: Secondary | ICD-10-CM | POA: Insufficient documentation

## 2019-01-18 DIAGNOSIS — Z8544 Personal history of malignant neoplasm of other female genital organs: Secondary | ICD-10-CM | POA: Insufficient documentation

## 2019-01-18 NOTE — ED Triage Notes (Signed)
Pt arrives via POV w/complaints of blurry vision, headaches, & facial numbness that started at 0900 today. pt has history of brain tumor, seizure disorder. Pt took seizure meds today.

## 2019-01-19 NOTE — ED Notes (Signed)
Pt ambulatory to waiting room. Pt verbalized understanding of discharge instructions.  Pt refused vital signs.

## 2019-01-19 NOTE — Discharge Instructions (Addendum)
You need to get a primary care doctor. You will need to have documentation that you are on oxycodone and phenobarbital if you want any doctor to prescribe them for you.

## 2019-01-19 NOTE — ED Notes (Signed)
Pt stating that she does not want to speak with the doctor anymore and would like to have her "release papers."

## 2019-01-19 NOTE — ED Provider Notes (Signed)
[facility] EMERGENCY DEPARTMENT Provider Note   CSN: [id number] Arrival date & time: 01/18/19  1956   Time seen 12:10 PM   History   Chief Complaint Chief Complaint  Patient presents with  . Blurred Vision    HPI [name] is a 43 y.o. female.     HPI patient states she had a brain tumor and had surgery done on July 17 at [facility] at [facility].  When I asked her how they diagnosed her brain tumor she said she was having increasing seizures.  She states she has had seizures since she was 43 years old.  She states she was released from prison on Sunday, September 20 and she states they did not prescribe her oxycodone or phenobarbital.  They did prescribe her Keppra.  She states she started having hot flashes yesterday about 5 PM.  She states she is seeing double and having blurred vision in the left side of her face has been numb since 830 this morning however she has been having those symptoms off and on since she had her surgery in July.  She denies nausea, vomiting, or diarrhea.  She states she is having a headache in her left temple today.  She describes it as sharp.  She states she feels dizzy.  Her husband states he brought her because he thought she "looked like she might have a seizure".  She has an appointment at the health department later this morning, and she has [facility] to [facility] and is going to fill out their paperwork on the 24th.  PCP none   Past Medical History:  Diagnosis Date  . AVM (arteriovenous malformation) brain   . 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 Chronic back pain   . Noncompliance with medication regimen   . Seizures [facility])     Patient Active Problem List   Diagnosis Date Noted  . Mixed incontinence urge and stress 11/09/2017    Past Surgical History:  Procedure Laterality Date  . ABDOMINAL HYSTERECTOMY    . CERVICAL CONIZATION W/BX N/A 08/05/2016   Procedure: COLD KNIFE CONIZATION OF CERVIX;  Surgeon: Jonnie Kind, MD;  Location: [facility]  ORS;  Service: Gynecology;  Laterality: N/A;  patient is an inmate at jail, so she cannot be contacted directly--must call the nurse, Jeral Fruit,  at [facility] jail to discuss preop labs, etc. at [phone].  Cannot discuss anything directly with patient. Angie at Dr. Johnnye Sima to call [name] at jail   . HYSTEROSCOPY W/D&C N/A 08/05/2016   Procedure: DILATATION AND CURETTAGE /HYSTEROSCOPY;  Surgeon: Jonnie Kind, MD;  Location: [facility] ORS;  Service: Gynecology;  Laterality: N/A;  . TUBAL LIGATION       OB History    Gravida  3   Para  3   Term  3   Preterm      AB      Living  3     SAB      TAB      Ectopic      Multiple      Live Births               Home Medications    Prior to Admission medications   Medication Sig Start Date End Date Taking? Authorizing Provider  PHENobarbital (LUMINAL) 64.8 MG tablet Take 64.8 mg by mouth at bedtime.   Yes [provider]  ibuprofen (ADVIL,MOTRIN) 800 MG tablet Take 1 tablet (800 mg  total) by mouth 3 (three) times daily. Patient not taking: Reported on 05/21/2018 11/06/17   Orpah Greek, MD  levETIRAcetam (KEPPRA) 500 MG tablet Take 1 tablet (500 mg total) by mouth 2 (two) times daily. 05/21/18   Virgel Manifold, MD    Family History Family History  Problem Relation Age of Onset  . Other Father        shot  . Cancer Mother     Social History Social History   Tobacco Use  . Smoking status: Current Every Day Smoker    Packs/day: 0.50    Years: 23.00    Pack years: 11.50    Types: Cigarettes  . Smokeless tobacco: Never Used  Substance Use Topics  . Alcohol use: Not Currently    Frequency: Never  . Drug use: Not Currently    Types: "Crack" cocaine     Allergies   Penicillins   Review of Systems Review of Systems  All other systems reviewed and are negative.    Physical Exam Updated Vital Signs BP (!) 144/90 (BP Location: Right Arm)   Pulse (!) 107   Temp 98 F (36.7 C)  (Oral)   Resp 17   Ht 5\' 6"  (1.676 m)   Wt 90.7 kg   LMP 08/02/2016   SpO2 97%   BMI 32.28 kg/m   Vital signs normal    Physical Exam Vitals signs and nursing note reviewed.  Constitutional:      General: She is not in acute distress.    Appearance: Normal appearance. She is well-developed. She is not ill-appearing or toxic-appearing.  HENT:     Head: Normocephalic.     Comments: Patient appears to have had a craniotomy in the left temporal area.    Right Ear: External ear normal.     Left Ear: External ear normal.     Nose: Nose normal. No mucosal edema or rhinorrhea.     Mouth/Throat:     Dentition: No dental abscesses.     Pharynx: No uvula swelling.  Eyes:     Extraocular Movements: Extraocular movements intact.     Conjunctiva/sclera: Conjunctivae normal.     Pupils: Pupils are equal, round, and reactive to light.  Neck:     Musculoskeletal: Full passive range of motion without pain, normal range of motion and neck supple.  Cardiovascular:     Rate and Rhythm: Normal rate and regular rhythm.     Heart sounds: Normal heart sounds. No murmur. No friction rub. No gallop.   Pulmonary:     Effort: Pulmonary effort is normal. No respiratory distress.     Breath sounds: Normal breath sounds. No wheezing, rhonchi or rales.  Chest:     Chest wall: No tenderness or crepitus.  Abdominal:     General: Bowel sounds are normal. There is no distension.     Palpations: Abdomen is soft.     Tenderness: There is no abdominal tenderness. There is no guarding or rebound.  Musculoskeletal: Normal range of motion.        General: No tenderness.     Comments: Moves all extremities well.   Skin:    General: Skin is warm and dry.     Coloration: Skin is not pale.     Findings: No erythema or rash.  Neurological:     General: No focal deficit present.     Mental Status: She is alert. She is disoriented.     Cranial Nerves: No cranial nerve deficit.  Psychiatric:        Mood and  Affect: Mood normal. Mood is not anxious.        Speech: Speech normal.        Behavior: Behavior normal.        Thought Content: Thought content normal.      ED Treatments / Results  Labs (all labs ordered are listed, but only abnormal results are displayed) Labs Reviewed - No data to display  EKG None  Radiology Ct Head Wo Contrast  Result Date: 01/18/2019 CLINICAL DATA:  43 year old female with chronic headache and blurry vision. EXAM: CT HEAD WITHOUT CONTRAST TECHNIQUE: Contiguous axial images were obtained from the base of the skull through the vertex without intravenous contrast. 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 Metallic surgical clip noted in the left middle cranial fossa. The ventricles are appropriate size for patient's age. There is no acute intracranial hemorrhage. No mass effect or midline shift. No extra-axial fluid collection. Vascular: No hyperdense vessel or unexpected calcification. Skull: Left frontotemporal craniotomy. No acute calvarial pathology. Sinuses/Orbits: No acute finding. Other: None. IMPRESSION: 1. No acute intracranial pathology. 2. Postsurgical changes of left frontotemporal craniotomy and left middle cranial fossa AVM resection. Electronically Signed   By: Anner Crete M.D.   On: 01/18/2019 20:42    Procedures Procedures (including critical care time)  Medications Ordered in ED Medications - No data to display   Initial Impression / Assessment and Plan / ED Course  I have reviewed the triage vital signs and the nursing notes.  Pertinent labs & imaging results that were available during my care of the patient were reviewed by me and considered in my medical decision making (see chart for details).    Patient cannot tell me what type of brain tumor she had.  She said it  started with for "a".  She does not recognize astrocytoma.  Looking at her CT done here it states she had a history of AVMs.  She states she never had radiation or chemotherapy.   When I review the [place] she has had no controlled substances prescribed in the past 2 years.  When I review care everywhere there are no records from [facility], when I enter her name it states no patient found.  There is a record from [facility] from a emergency department visit in 2003.  They show me some paperwork they have from the prison when she was released and there is no documentation that she was on phenobarbital or oxycodone.  I was going to treat patient for a migraine headache.  However when I explained to her I could not prescribe her oxycodone or phenobarbital unless she could show me documentation that she was actually on it.  She immediately got upset and started yelling "see I told you about these doctors".  She then told nursing staff she did not want to see me anymore.  She also then brought up that she had a lawyer and was going to sue me.  I was going to treat her migraine headache however if she is not going to let me reevaluate her I did not feel comfortable giving her any medications.  She was allowed to sign out AMA.  She has an appointment this morning at the health department.  If they want to prescribe her these medications  without documentation that is their prerogative.  Final Clinical Impressions(s) / ED Diagnoses   Final diagnoses:  Nonintractable headache, unspecified chronicity pattern, unspecified headache type  Drug-seeking behavior    Pt signed out AMA  Rolland Porter, MD, Barbette Or, MD 01/19/19 [phone]

## 2019-02-18 ENCOUNTER — Other Ambulatory Visit: Payer: Self-pay

## 2019-02-18 DIAGNOSIS — Z20822 Contact with and (suspected) exposure to covid-19: Secondary | ICD-10-CM

## 2019-02-20 LAB — NOVEL CORONAVIRUS, NAA: SARS-CoV-2, NAA: NOT DETECTED

## 2019-03-14 ENCOUNTER — Encounter (HOSPITAL_COMMUNITY): Payer: Self-pay | Admitting: *Deleted

## 2019-03-14 ENCOUNTER — Other Ambulatory Visit: Payer: Self-pay

## 2019-03-14 ENCOUNTER — Emergency Department (HOSPITAL_COMMUNITY): Payer: Self-pay

## 2019-03-14 ENCOUNTER — Emergency Department (HOSPITAL_COMMUNITY)
Admission: EM | Admit: 2019-03-14 | Discharge: 2019-03-14 | Disposition: A | Discharge status: Home / Self Care | Payer: Self-pay | Attending: Emergency Medicine | Admitting: Emergency Medicine

## 2019-03-14 DIAGNOSIS — Z5321 Procedure and treatment not carried out due to patient leaving prior to being seen by health care provider: Secondary | ICD-10-CM | POA: Insufficient documentation

## 2019-03-14 DIAGNOSIS — W19XXXA Unspecified fall, initial encounter: Secondary | ICD-10-CM

## 2019-03-14 DIAGNOSIS — R569 Unspecified convulsions: Secondary | ICD-10-CM | POA: Insufficient documentation

## 2019-03-14 NOTE — ED Triage Notes (Signed)
States she had a seizure last night and today has a knot in the back of her head and bruised area on right buttock

## 2019-03-14 NOTE — ED Notes (Signed)
Patient could not be found for labs and xrays at 1310

## 2019-04-07 ENCOUNTER — Other Ambulatory Visit: Payer: Self-pay

## 2019-04-07 ENCOUNTER — Ambulatory Visit: Payer: Self-pay | Admitting: Neurology

## 2019-04-07 ENCOUNTER — Encounter: Payer: Self-pay | Admitting: Neurology

## 2019-04-07 VITALS — BP 132/84 | HR 93 | Temp 97.8°F | Ht 66.0 in | Wt 192.0 lb

## 2019-04-07 DIAGNOSIS — Q282 Arteriovenous malformation of cerebral vessels: Secondary | ICD-10-CM

## 2019-04-07 DIAGNOSIS — G40019 Localization-related (focal) (partial) idiopathic epilepsy and epileptic syndromes with seizures of localized onset, intractable, without status epilepticus: Secondary | ICD-10-CM | POA: Insufficient documentation

## 2019-04-07 DIAGNOSIS — G40009 Localization-related (focal) (partial) idiopathic epilepsy and epileptic syndromes with seizures of localized onset, not intractable, without status epilepticus: Secondary | ICD-10-CM

## 2019-04-07 DIAGNOSIS — Z79899 Other long term (current) drug therapy: Secondary | ICD-10-CM

## 2019-04-07 MED ORDER — LEVETIRACETAM 500 MG PO TABS: 1500.0000 mg | ORAL_TABLET | Freq: Two times a day (BID) | ORAL | 11 refills | Status: DC

## 2019-04-07 NOTE — Patient Instructions (Signed)
Follow up 6 months We will refill your medications We will get blood work today  Per [employer], patients with seizures are not allowed to drive until they have been seizure-free for six months.    Use caution when using heavy equipment or power tools. Avoid working on ladders or at heights. Take showers instead of baths. Ensure the water temperature is not too high on the home water heater. Do not go swimming alone. Do not lock yourself in a room alone (i.e. bathroom). When caring for infants or small children, sit down when holding, feeding, or changing them to minimize risk of injury to the child in the event you have a seizure. Maintain good sleep hygiene. Avoid alcohol.    If patient has another seizure, call 911 and bring them back to the ED if: A.  The seizure lasts longer than 5 minutes.      B.  The patient doesn't wake shortly after the seizure or has new problems such as difficulty seeing, speaking or moving following the seizure C.  The patient was injured during the seizure D.  The patient has a temperature over 102 F (39C) E.  The patient vomited during the seizure and now is having trouble breathing  Per [facility] statutes, patients with seizures are not allowed to drive until they have been seizure-free for six months.  Other recommendations include using caution when using heavy equipment or power tools. Avoid working on ladders or at heights. Take showers instead of baths.  Do not swim alone.  Ensure the water temperature is not too high on the home water heater. Do not go swimming alone. Do not lock yourself in a room alone (i.e. bathroom). When caring for infants or small children, sit down when holding, feeding, or changing them to minimize risk of injury to the child in the event you have a seizure. Maintain good sleep hygiene. Avoid alcohol.  Also recommend adequate sleep, hydration, good diet and minimize stress.  During the Seizure  - First,  ensure adequate ventilation and place patients on the floor on their left side  Loosen clothing around the neck and ensure the airway is patent. If the patient is clenching the teeth, do not force the mouth open with any object as this can cause severe damage - Remove all items from the surrounding that can be hazardous. The patient may be oblivious to what's happening and may not even know what he or she is doing. If the patient is confused and wandering, either gently guide him/her away and block access to outside areas - Reassure the individual and be comforting - Call 911. In most cases, the seizure ends before EMS arrives. However, there are cases when seizures may last over 3 to 5 minutes. Or the individual may have developed breathing difficulties or severe injuries. If a pregnant patient or a person with diabetes develops a seizure, it is prudent to call an ambulance. - Finally, if the patient does not regain full consciousness, then call EMS. Most patients will remain confused for about 45 to 90 minutes after a seizure, so you must use judgment in calling for help. - Avoid restraints but make sure the patient is in a bed with padded side rails - Place the individual in a lateral position with the neck slightly flexed; this will help the saliva drain from the mouth and prevent the tongue from falling backward - Remove all nearby furniture and other hazards from the area -  Provide verbal assurance as the individual is regaining consciousness - Provide the patient with privacy if possible - Call for help and start treatment as ordered by the caregiver   fter the Seizure (Postictal Stage)  After a seizure, most patients experience confusion, fatigue, muscle pain and/or a headache. Thus, one should permit the individual to sleep. For the next few days, reassurance is essential. Being calm and helping reorient the person is also of importance.  Most seizures are painless and end spontaneously.  Seizures are not harmful to others but can lead to complications such as stress on the lungs, brain and the heart. Individuals with prior lung problems may develop labored breathing and respiratory distress.    Levetiracetam tablets What is this medicine? LEVETIRACETAM (lee ve tye RA se tam) is an antiepileptic drug. It is used with other medicines to treat certain types of seizures. This medicine may be used for other purposes; ask your health care provider or pharmacist if you have questions. COMMON BRAND NAME(S): Keppra, Roweepra What should I tell my health care provider before I take this medicine? They need to know if you have any of these conditions:  kidney disease  suicidal thoughts, plans, or attempt; a previous suicide attempt by you or a family member  an unusual or allergic reaction to levetiracetam, other medicines, foods, dyes, or preservatives  pregnant or trying to get pregnant  breast-feeding How should I use this medicine? Take this medicine by mouth with a glass of water. Follow the directions on the prescription label. Swallow the tablets whole. Do not crush or chew this medicine. You may take this medicine with or without food. Take your doses at regular intervals. Do not take your medicine more often than directed. Do not stop taking this medicine or any of your seizure medicines unless instructed by your doctor or health care professional. Stopping your medicine suddenly can increase your seizures or their severity. A special MedGuide will be given to you by the pharmacist with each prescription and refill. Be sure to read this information carefully each time. Contact your pediatrician or health care professional regarding the use of this medication in children. While this drug may be prescribed for children as young as 63 years of age for selected conditions, precautions do apply. Overdosage: If you think you have taken too much of this medicine contact a poison  control center or emergency room at once. NOTE: This medicine is only for you. Do not share this medicine with others. What if I miss a dose? If you miss a dose, take it as soon as you can. If it is almost time for your next dose, take only that dose. Do not take double or extra doses. What may interact with this medicine? This medicine may interact with the following medications:  carbamazepine  colesevelam  probenecid  sevelamer This list may not describe all possible interactions. Give your health care provider a list of all the medicines, herbs, non-prescription drugs, or dietary supplements you use. Also tell them if you smoke, drink alcohol, or use illegal drugs. Some items may interact with your medicine. What should I watch for while using this medicine? Visit your doctor or health care provider for a regular check on your progress. Wear a medical identification bracelet or chain to say you have epilepsy, and carry a card that lists all your medications. This medicine may cause serious skin reactions. They can happen weeks to months after starting the medicine. Contact your health care  provider right away if you notice fevers or flu-like symptoms with a rash. The rash may be red or purple and then turn into blisters or peeling of the skin. Or, you might notice a red rash with swelling of the face, lips or lymph nodes in your neck or under your arms. It is important to take this medicine exactly as instructed by your health care provider. When first starting treatment, your dose may need to be adjusted. It may take weeks or months before your dose is stable. You should contact your doctor or health care provider if your seizures get worse or if you have any new types of seizures. You may get drowsy or dizzy. Do not drive, use machinery, or do anything that needs mental alertness until you know how this medicine affects you. Do not stand or sit up quickly, especially if you are an older  patient. This reduces the risk of dizzy or fainting spells. Alcohol may interfere with the effect of this medicine. Avoid alcoholic drinks. The use of this medicine may increase the chance of suicidal thoughts or actions. Pay special attention to how you are responding while on this medicine. Any worsening of mood, or thoughts of suicide or dying should be reported to your health care provider right away. Women who become pregnant while using this medicine may enroll in the [place] Pregnancy Registry by calling [phone]. This registry collects information about the safety of antiepileptic drug use during pregnancy. What side effects may I notice from receiving this medicine? Side effects that you should report to your doctor or health care professional as soon as possible:  allergic reactions like skin rash, itching or hives, swelling of the face, lips, or tongue  breathing problems  dark urine  general ill feeling or flu-like symptoms  problems with balance, talking, walking  rash, fever, and swollen lymph nodes  redness, blistering, peeling or loosening of the skin, including inside the mouth  unusually weak or tired  worsening of mood, thoughts or actions of suicide or dying  yellowing of the eyes or skin Side effects that usually do not require medical attention (report to your doctor or health care professional if they continue or are bothersome):  diarrhea  dizzy, drowsy  headache  loss of appetite This list may not describe all possible side effects. Call your doctor for medical advice about side effects. You may report side effects to FDA at 1-800-FDA-1088. Where should I keep my medicine? Keep out of reach of children. Store at room temperature between 15 and 30 degrees C (59 and 86 degrees F). Throw away any unused medicine after the expiration date. NOTE: This sheet is a summary. It may not cover all possible information. If you have  questions about this medicine, talk to your doctor, pharmacist, or health care provider.  2020 Elsevier/Gold Standard (2018-07-16 15:23:36)  Phenobarbital tablets What is this medicine? PHENOBARBITAL (fee noe BAR bi tal) is a barbiturate. It may be used to help you sleep or may be used to help control seizures. This medicine may be used for other purposes; ask your health care provider or pharmacist if you have questions. What should I tell my health care provider before I take this medicine? They need to know if you have any of these conditions:  drug abuse or addiction  if you frequently drink alcohol containing drinks  kidney disease  liver disease  lung disease or breathing problems  porphyria  suicidal thoughts, plans, or  attempt; a previous suicide attempt by you or a family member  an unusual or allergic reaction to phenobarbital, other barbiturates, other medicines, lactose, foods, dyes, or preservatives  pregnant or trying to get pregnant  breast-feeding How should I use this medicine? Take this medicine by mouth with a full glass of water. Follow the directions on the prescription label. Take your medicine at regular intervals. Do not take your medicine more often than directed. Do not stop taking except on your doctor's advice. If you have been taking this medicine regularly and suddenly stop taking it, you may increase the risk of seizures. Your doctor may gradually reduce the dose. Talk to your pediatrician regarding the use of this medicine in children. Special care may be needed. Patients over 67 years old may have a stronger reaction and need a smaller dose. Overdosage: If you think you have taken too much of this medicine contact a poison control center or emergency room at once. NOTE: This medicine is only for you. Do not share this medicine with others. What if I miss a dose? If you miss a dose, take it as soon as you can. If it is almost time for your next  dose, take only that dose. Do not take double or extra doses. What may interact with this medicine? Do not take this medicine with any of the following medications:  certain medicines used to treat HIV infection or AIDS that are given in combination with cobicistat   other barbiturates   primidone   voriconazole This medicine may also interact with the following medications:   alcohol or medicines that contain alcohol   antihistamines   medicines for depression, anxiety, or psychotic disturbances   medicines for pain including pentazocine, buprenorphine, butorphanol, nalbuphine, tramadol, and propoxyphene   medicines for sleep   muscle relaxants   steroid medicines like prednisone or cortisone This list may not describe all possible interactions. Give your health care provider a list of all the medicines, herbs, non-prescription drugs, or dietary supplements you use. Also tell them if you smoke, drink alcohol, or use illegal drugs. Some items may interact with your medicine. What should I watch for while using this medicine? Visit your doctor or health care provider for regular checks on your progress. This medicine may cause serious skin reactions. They can happen weeks to months after starting the medicine. Contact your health care provider right away if you notice fevers or flu-like symptoms with a rash. The rash may be red or purple and then turn into blisters or peeling of the skin. Or, you might notice a red rash with swelling of the face, lips or lymph nodes in your neck or under your arms. If you are taking this medicine for seizures, wear a medical identification bracelet or chain to say you have seizures, and carry a card that lists all your medications. You may get drowsy or dizzy. Do not drive, use machinery, or do anything that needs mental alertness until you know how this medicine affects you. Do not stand or sit up quickly, especially if you are an older  patient. This reduces the risk of dizzy or fainting spells. Alcohol may interfere with the effect of this medicine. Avoid alcoholic drinks. Birth control pills may not work properly while you are taking this medicine. Talk to your doctor about using an extra method of birth control. The use of this medicine may increase the chance of suicidal thoughts or actions. Pay special attention to how you  are responding while on this medicine. Any worsening of mood, or thoughts of suicide or dying should be reported to your health care provider right away. Women who become pregnant while using this medicine may enroll in the [place] Pregnancy Registry by calling [phone]. This registry collects information about the safety of antiepileptic drug use during pregnancy. This medicine may cause a decrease in vitamin D and folic acid. You should make sure that you get enough vitamins while you are taking this medicine. Discuss the foods you eat and the vitamins you take with your health care provider. What side effects may I notice from receiving this medicine? Side effects that you should report to your doctor or health care professional as soon as possible:  allergic reactions like skin rash, itching or hives, swelling of the face, lips, or tongue  breathing problems  fever, chills, or sore throat  rash, fever, and swollen lymph nodes  redness, blistering, peeling or loosening of the skin, including inside the mouth  unusual bleeding or bruising  unusually weak or tired  worsening of mood, thoughts or actions of suicide or dying  yellowing of the eyes or skin Side effects that usually do not require medical attention (report to your doctor or health care professional if they continue or are bothersome):  dizziness  drowsiness  headache  irritability or nervousness  nausea This list may not describe all possible side effects. Call your doctor for medical advice  about side effects. You may report side effects to FDA at 1-800-FDA-1088. Where should I keep my medicine? Keep out of the reach of children. This medicine can be abused. Keep your medicine in a safe place to protect it from theft. Do not share this medicine with anyone. Selling or giving away this medicine is dangerous and against the law. This medicine may cause accidental overdose and death if taken by other adults, children, or pets. Mix any unused medicine with a substance like cat litter or coffee grounds. Then throw the medicine away in a sealed container like a sealed bag or a coffee can with a lid. Do not use the medicine after the expiration date. Store at room temperature between 20 and 25 degrees C (68 and 77 degrees F). Keep container tightly closed. Protect from light. NOTE: This sheet is a summary. It may not cover all possible information. If you have questions about this medicine, talk to your doctor, pharmacist, or health care provider.  2020 Elsevier/Gold Standard (2018-07-23 13:37:54)

## 2019-04-07 NOTE — Progress Notes (Signed)
[facility]    Provider:  Dr Jaynee Eagles Requesting Provider: Soyla Dryer, PA-C Primary Care Provider:  Soyla Dryer, PA-C  CC:  Hx of seizures  HPI:  [name] is a 43 y.o. female here as requested by Soyla Dryer, PA-C for history of seizures. PMHx seizures, non compliance. Chronic back pain, AVM.  Patient was seen by Dr. Merlene Laughter in the past but apparently she was dismissed(per notes "she dismissed herself" from the practice) from their practice, she was given [place] neurology numbers and she was told she could come here for care instead.  She is referred here by her primary care.  Patient states she went to prison for 7 months and during her stay she had "bad seizures and brain tumor removed", she supposed to be on phenobarb 64.8 mg however she does not get Rx, she does have Keppra and currently takes it, she went to [facility] because she had blurred vision with headaches and states they were not helpful with her care, Dr. Merlene Laughter was her neurologist in the past.  Usually she has 3 seizure episodes per week.  Patient states her and Dr. Merlene Laughter did not get along.  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 She states she has only had 2 seizures, she had the surgery July 17th of this year, she was in [place] she went to [facility],  She brings a bottle of phenobarb today, 64.8mg  twice daily, Keppra IR 500mg  tabs take 1500mg  twice daily. She is a poor historian but she reports [facility] hospital performed her surgery and started her on phenobarb and keppra. She does not drive. Last eizure was several months ago. Last August. None since then. She blanks out and shakes. Poor historian. I have requested all info from [place] as well as Dr. Merlene Laughter.  Review of Systems: Patient complains of symptoms per HPI as well as the following symptoms: seizure, chronic pain. Pertinent negatives and positives per HPI. All others negative.   Social History     Socioeconomic History  . Marital status: Divorced    Spouse name: Not on file  . Number of children: Not on file  . Years of education: Not on file  . Highest education level: Not on file  Occupational History  . Not on file  Tobacco Use  . Smoking status: Current Every Day Smoker    Packs/day: 0.50    Years: 23.00    Pack years: 11.50    Types: Cigarettes  . Smokeless tobacco: Never Used  Substance and Sexual Activity  . Alcohol use: Not Currently  . Drug use: Not Currently    Types: "Crack" cocaine  . Sexual activity: Not Currently    Birth control/protection: Surgical    Comment: hyst  Other Topics Concern  . Not on file  Social History Narrative   Lives at home with her mother   Caffeine: 1 cup of coffee, about 3 cups of tea per day   Social Determinants of Health   Financial Resource Strain:   . Difficulty of Paying Living Expenses: Not on file  Food Insecurity:   . Worried About Charity fundraiser in the Last Year: Not on file  . Ran Out of Food in the Last Year: Not on file  Transportation Needs:   . Lack of Transportation (Medical): Not on file  . Lack of Transportation (Non-Medical): Not on file  Physical Activity:   . Days of Exercise per Week: Not on  file  . Minutes of Exercise per Session: Not on file  Stress:   . Feeling of Stress : Not on file  Social Connections:   . Frequency of Communication with Friends and Family: Not on file  . Frequency of Social Gatherings with Friends and Family: Not on file  . Attends Religious Services: Not on file  . Active Member of Clubs or Organizations: Not on file  . Attends Archivist Meetings: Not on file  . Marital Status: Not on file  Intimate Partner Violence:   . Fear of Current or Ex-Partner: Not on file  . Emotionally Abused: Not on file  . Physically Abused: Not on file  . Sexually Abused: Not on file    Family History  Problem Relation Age of Onset  . Other Father        shot  .  Cancer Mother   . Seizures Sister   . AVM Sister     Past Medical History:  Diagnosis Date  . AVM (arteriovenous malformation) brain   . 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 Chronic back pain   . Noncompliance with medication regimen   . Seizures [facility])     Patient Active Problem List   Diagnosis Date Noted  . 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 Localization-related (focal) (partial) idiopathic epilepsy and epileptic syndromes with seizures of localized onset, intractable, without status epilepticus ([place]) 04/07/2019  . Mixed incontinence urge and stress 11/09/2017    Past Surgical History:  Procedure Laterality Date  . ABDOMINAL HYSTERECTOMY    . CERVICAL CONIZATION W/BX N/A 08/05/2016   Procedure: COLD KNIFE CONIZATION OF CERVIX;  Surgeon: Jonnie Kind, MD;  Location: [facility];  Service: Gynecology;  Laterality: N/A;  patient is an inmate at jail, so she cannot be contacted directly--must call the nurse, Jeral Fruit,  at [facility] jail to discuss preop labs, etc. at [phone].  Cannot discuss anything directly with patient. Angie at Dr. Johnnye Sima to call [name] at jail   . HYSTEROSCOPY W/D&C N/A 08/05/2016   Procedure: DILATATION AND CURETTAGE /HYSTEROSCOPY;  Surgeon: Jonnie Kind, MD;  Location: [facility];  Service: Gynecology;  Laterality: N/A;  . TUBAL LIGATION      Current Outpatient Medications  Medication Sig Dispense Refill  . PHENobarbital (LUMINAL) 64.8 MG tablet Take 64.8 mg by mouth at bedtime.    . levETIRAcetam (KEPPRA) 500 MG tablet Take 1 tablet (500 mg total) by mouth 2 (two) times daily. (Patient not taking: Reported on 04/07/2019) 60 tablet 1  . levETIRAcetam (KEPPRA) 500 MG tablet Take 3 tablets (1,500 mg total) by mouth 2 (two) times daily. 180 tablet 11   No current facility-administered medications for this visit.    Allergies as of 04/07/2019 - Review Complete 04/07/2019  Allergen Reaction  Noted  . Penicillins Rash 11/03/2010    Vitals: BP 132/84 (BP Location: Right Arm, Patient Position: Sitting)   Pulse 93   Temp 97.8 F (36.6 C) Comment: taken at front  Ht 5\' 6"  (1.676 m)   Wt 192 lb (87.1 kg)   LMP 08/02/2016   BMI 30.99 kg/m  Last Weight:  Wt Readings from Last 1 Encounters:  04/07/19 192 lb (87.1 kg)   Last Height:   Ht Readings from Last 1 Encounters:  04/07/19 5\' 6"  (1.676 m)     Physical exam: Exam: Gen: NAD, conversant, well nourised, well groomed  CV: RRR, no MRG. No Carotid Bruits. No peripheral edema, warm, nontender Eyes: Conjunctivae clear without exudates or hemorrhage  Neuro: Detailed Neurologic Exam  Speech:    Speech is normal; fluent and spontaneous with normal comprehension.  Cognition:    The patient is oriented to person, place, and time;     recent and remote memory intact;     language fluent;     normal attention, concentration,     fund of knowledge Cranial Nerves:    The pupils are equal, round, and reactive to light.Attempted fundoscopy could not visualize. Visual fields are full to finger confrontation. Extraocular movements are intact. Trigeminal sensation is intact and the muscles of mastication are normal. The face is symmetric. The palate elevates in the midline. Hearing intact. Voice is normal. Shoulder shrug is normal. The tongue has normal motion without fasciculations.   Coordination:    No dysmetria  Gait:    Normal native gait  Motor Observation:    No asymmetry, no atrophy, and no involuntary movements noted. Tone:    Normal muscle tone.    Posture:    Posture is normal. normal erect    Strength:    Strength is symmetric in the upper and lower limbs.      Sensation: intact to LT     Reflex Exam:  DTR's:    Deep tendon reflexes in the upper and lower extremities are symmetrical bilaterally.   Toes:    The toes are equiv bilaterally.   Clonus:    Clonus is absent.     Assessment/Plan:  43 y.o. female here as requested by Soyla Dryer, PA-C for history of seizures. PMHx seizures, non compliance. Chronic back pain, AVM.  Poor historian, sounds like she had seizures secondary to AVM which was resected. I have requested [facility] and Dr. Freddie Apley records. I am not sure why phenobarb was started, this is an older drug with more long-term side effects than more novel medications but I will have to see all the records. Discussed risks including osteoporosis she needs to follow with primary care, make sure to take Vitamin D and Calcium, also take Folate, recommend not getting pregnant. In the meantime I will check levels. Restart Keppra, gave her coupon from [employer] and showed her how she can get medications cheaper, also spent some time giving her and her family information on [facility] financial assistance. No driving. Seizure precautions. Do not skip medications can cause seizure breakthrough.  Orders Placed This Encounter  Procedures  . Phenobarbital level  . CBC  . CMP   Meds ordered this encounter  Medications  . levETIRAcetam (KEPPRA) 500 MG tablet    Sig: Take 3 tablets (1,500 mg total) by mouth 2 (two) times daily.    Dispense:  180 tablet    Refill:  11    Cc: Soyla Dryer, PA-C   Sarina Ill, MD  [facility] [address] [place],  [address]  Phone [phone] Fax [phone]

## 2019-04-08 LAB — CBC
Hematocrit: 44.7 % (ref 34.0–46.6)
Hemoglobin: 15 g/dL (ref 11.1–15.9)
MCH: 31.4 pg (ref 26.6–33.0)
MCHC: 33.6 g/dL (ref 31.5–35.7)
MCV: 94 fL (ref 79–97)
Platelets: 274 10*3/uL (ref 150–450)
RBC: 4.77 x10E6/uL (ref 3.77–5.28)
RDW: 14.2 % (ref 11.7–15.4)
WBC: 8.3 10*3/uL (ref 3.4–10.8)

## 2019-04-08 LAB — COMPREHENSIVE METABOLIC PANEL
ALT: 17 IU/L (ref 0–32)
AST: 11 IU/L (ref 0–40)
Albumin/Globulin Ratio: 2 (ref 1.2–2.2)
Albumin: 4.4 g/dL (ref 3.8–4.8)
Alkaline Phosphatase: 131 IU/L — ABNORMAL HIGH (ref 39–117)
BUN/Creatinine Ratio: 14 (ref 9–23)
BUN: 13 mg/dL (ref 6–24)
Bilirubin Total: <0.2 mg/dL (ref 0.0–1.2)
CO2: 23 mmol/L (ref 20–29)
Calcium: 9.1 mg/dL (ref 8.7–10.2)
Chloride: 105 mmol/L (ref 96–106)
Creatinine, Ser: 0.9 mg/dL (ref 0.57–1.00)
GFR calc Af Amer: 91 mL/min/{1.73_m2} (ref >59)
GFR calc non Af Amer: 79 mL/min/{1.73_m2} (ref >59)
Globulin, Total: 2.2 g/dL (ref 1.5–4.5)
Glucose: 87 mg/dL (ref 65–99)
Potassium: 4.5 mmol/L (ref 3.5–5.2)
Sodium: 143 mmol/L (ref 134–144)
Total Protein: 6.6 g/dL (ref 6.0–8.5)

## 2019-04-08 LAB — PHENOBARBITAL LEVEL: Phenobarbital, Serum: 9 ug/mL — ABNORMAL LOW (ref 15–40)

## 2019-04-11 ENCOUNTER — Telehealth: Payer: Self-pay | Admitting: *Deleted

## 2019-04-11 NOTE — Telephone Encounter (Signed)
R/c notes from [place] records on [place] desk.

## 2019-04-12 ENCOUNTER — Telehealth: Payer: Self-pay | Admitting: *Deleted

## 2019-04-12 NOTE — Telephone Encounter (Signed)
I called Dr Dionicia Abler office 3 times requesting pt records. I left voice mails no return calls.

## 2019-04-29 ENCOUNTER — Other Ambulatory Visit: Payer: Self-pay | Admitting: Neurology

## 2019-05-01 ENCOUNTER — Telehealth: Payer: Self-pay | Admitting: Neurology

## 2019-05-01 NOTE — Telephone Encounter (Signed)
[name], When I try to approve her Keppra I get a warning saying patient is also on BRIVARACETAM which is in the same class of medication as keppra (levetiracetam). Before I approve Keppra can you ensure she is not also on Brivaracetam please? Thanks

## 2019-05-02 NOTE — Telephone Encounter (Addendum)
Tried to reach pt's sister Lattie Haw (on [place]). Her mother Braulio Conte (also on [place]) answered. Pt's sister Lattie Haw handles the medications. Mardene Celeste will have Lattie Haw call us back as soon as she is available. I gave her the office number.   When [place] calls back, if nurse is unavailable please find out if the patient takes Brivaracetam? We are trying to refill the Levetiracetam but it shows patient is on Brivaracetam. We need to clarify before we can send any refills. Per pt's medication list, she only takes Phenobarbital 64.8 mg at bedtime and Levetiracetam 500 mg, three tablets (1500 mg total) twice daily.

## 2019-05-02 NOTE — Telephone Encounter (Signed)
Per Dr. Jaynee Eagles, I reached out to pt confirm if she taking Brivaracetam. I am waiting to hear back from the sister.

## 2019-05-03 ENCOUNTER — Other Ambulatory Visit: Payer: Self-pay | Admitting: Neurology

## 2019-05-03 MED ORDER — LEVETIRACETAM 500 MG PO TABS: 1500.0000 mg | ORAL_TABLET | Freq: Two times a day (BID) | ORAL | 11 refills | Status: DC

## 2019-05-03 MED ORDER — PHENOBARBITAL 64.8 MG PO TABS: 64.8000 mg | ORAL_TABLET | Freq: Two times a day (BID) | ORAL | 4 refills | Status: AC

## 2019-05-03 NOTE — Telephone Encounter (Signed)
I have refilled medications as stated in the email, Keppra 1500mg  bid and phenobarb 64.8 bid. thanks

## 2019-05-03 NOTE — Telephone Encounter (Signed)
Spoke with pt's sister [name] (on [place]) and she stated the pt is only taking two medications, Phenobarbital and Levetiracetam. She takes Levetiracetam 1500 mg twice daily. She stated the bottle of the Phenobarbital stated to take 64.8 mg twice daily and that is how the patient has been taking it since September. I informed her we only have a bedtime 64.8 mg dose on file. She did run out for a period of time before the OV with Dr. Jaynee Eagles and pt had to go to the health dept for a refill. I told her I would send the message to Dr. Jaynee Eagles for recommendations on the phenobarbital.

## 2019-05-04 NOTE — Telephone Encounter (Addendum)
I called the pt's sister Lattie Haw (on [place]) and LVM asking for call back. When she calls back, please let her know that Dr. Jaynee Eagles sent in refills of the Levetiracetam (Keppra) and Phenobarbital to [facility]. She is to take 3 tablets of Levetiracetam twice daily and 1 tablet of Phenobarbital twice daily.

## 2019-05-11 ENCOUNTER — Other Ambulatory Visit: Payer: Self-pay | Admitting: *Deleted

## 2019-05-11 MED ORDER — LEVETIRACETAM 500 MG PO TABS: 1500.0000 mg | ORAL_TABLET | Freq: Two times a day (BID) | ORAL | 11 refills | Status: AC

## 2019-05-11 NOTE — Telephone Encounter (Addendum)
We received a prescription request for phenobarbital 64.8 mg from [facility] in [name] on [address]. I called the pt's sister Lattie Haw (on [place]) and discussed as the previous prescription was sent to [facility] on [address] She stated this was fine. Keppra & Phenobarbital sent there. She verbalized appreciation for the call and her questions were answered.

## 2019-10-06 ENCOUNTER — Ambulatory Visit: Payer: Self-pay | Admitting: Neurology

## 2019-11-18 ENCOUNTER — Emergency Department (HOSPITAL_COMMUNITY)
Admission: EM | Admit: 2019-11-18 | Discharge: 2019-11-18 | Disposition: A | Discharge status: Home / Self Care | Payer: Self-pay | Attending: Emergency Medicine | Admitting: Emergency Medicine

## 2019-11-18 ENCOUNTER — Encounter (HOSPITAL_COMMUNITY): Payer: Self-pay

## 2019-11-18 ENCOUNTER — Other Ambulatory Visit: Payer: Self-pay

## 2019-11-18 ENCOUNTER — Emergency Department (HOSPITAL_COMMUNITY): Payer: Self-pay

## 2019-11-18 DIAGNOSIS — F17210 Nicotine dependence, cigarettes, uncomplicated: Secondary | ICD-10-CM | POA: Insufficient documentation

## 2019-11-18 DIAGNOSIS — S39012A Strain of muscle, fascia and tendon of lower back, initial encounter: Secondary | ICD-10-CM

## 2019-11-18 DIAGNOSIS — Y999 Unspecified external cause status: Secondary | ICD-10-CM | POA: Insufficient documentation

## 2019-11-18 DIAGNOSIS — Y939 Activity, unspecified: Secondary | ICD-10-CM | POA: Insufficient documentation

## 2019-11-18 DIAGNOSIS — R569 Unspecified convulsions: Secondary | ICD-10-CM

## 2019-11-18 DIAGNOSIS — Y929 Unspecified place or not applicable: Secondary | ICD-10-CM | POA: Insufficient documentation

## 2019-11-18 DIAGNOSIS — Z8544 Personal history of malignant neoplasm of other female genital organs: Secondary | ICD-10-CM | POA: Insufficient documentation

## 2019-11-18 LAB — PHENOBARBITAL LEVEL: Phenobarbital: 19.3 ug/mL (ref 15.0–30.0)

## 2019-11-18 MED ORDER — KETOROLAC TROMETHAMINE 30 MG/ML IJ SOLN
30.0000 mg | Freq: Once | INTRAMUSCULAR | Status: AC
  Administered 2019-11-18: 30 mg via INTRAMUSCULAR
  Filled 2019-11-18: qty 1

## 2019-11-18 MED ORDER — METHOCARBAMOL 750 MG PO TABS
750.0000 mg | ORAL_TABLET | Freq: Three times a day (TID) | ORAL | 0 refills | Status: AC
Start: 2019-11-18 — End: ?

## 2019-11-18 MED ORDER — PREDNISONE 10 MG PO TABS
ORAL_TABLET | ORAL | 0 refills | Status: AC
Start: 2019-11-18 — End: ?

## 2019-11-18 MED ORDER — PREDNISONE 50 MG PO TABS
60.0000 mg | ORAL_TABLET | Freq: Once | ORAL | Status: AC
  Administered 2019-11-18: 60 mg via ORAL
  Filled 2019-11-18: qty 1

## 2019-11-18 MED ORDER — METHOCARBAMOL 500 MG PO TABS
750.0000 mg | ORAL_TABLET | Freq: Once | ORAL | Status: AC
  Administered 2019-11-18: 750 mg via ORAL
  Filled 2019-11-18: qty 2
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
## 2019-11-18 NOTE — ED Notes (Signed)
Pt found asleep in [place]   Pt relations tried to awaken and could not   Brought to rm 24

## 2019-11-18 NOTE — Discharge Instructions (Signed)
Take your next dose of prednisone tomorrow evening.  Use the muscle relaxer as directed.  Do not drive within 4 hours of taking this medicine as this will make you drowsy.  Avoid lifting,  Bending,  Twisting or any other activity that worsens your pain over the next week.  Apply an  icepack  to your lower back for 10-15 minutes every 2 hours for the next 2 days.  You should get rechecked if your symptoms are not better over the next 5 days,  Or you develop increased pain,  Weakness in your leg(s) or loss of bladder or bowel function - these can be symptoms of a worsening condition.  Your xrays are negative for acute bony fracture or dislocation.    Also, your phenobarbitol is within the therapeutic range.  Followup  with your primary MD for a recheck if your back pain is not improving with this treatment plan.

## 2019-11-18 NOTE — ED Notes (Signed)
Pt appears impaired   She when asked questions looks at nurse, begins moving in chair and says "my back"  Does not answer any queries as to when it began,  Where specifically pain is
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
## 2019-11-18 NOTE — ED Notes (Signed)
Not in WR when called 

## 2020-05-10 ENCOUNTER — Other Ambulatory Visit: Payer: Self-pay

## 2020-05-10 ENCOUNTER — Emergency Department (HOSPITAL_COMMUNITY): Payer: Medicaid Other

## 2020-05-10 ENCOUNTER — Emergency Department (HOSPITAL_COMMUNITY)
Admission: EM | Admit: 2020-05-10 | Discharge: 2020-05-10 | Disposition: A | Discharge status: Home / Self Care | Payer: Medicaid Other | Attending: Emergency Medicine | Admitting: Emergency Medicine

## 2020-05-10 ENCOUNTER — Encounter (HOSPITAL_COMMUNITY): Payer: Self-pay

## 2020-05-10 DIAGNOSIS — S1191XA Laceration without foreign body of unspecified part of neck, initial encounter: Secondary | ICD-10-CM | POA: Insufficient documentation

## 2020-05-10 DIAGNOSIS — F17210 Nicotine dependence, cigarettes, uncomplicated: Secondary | ICD-10-CM | POA: Insufficient documentation

## 2020-05-10 LAB — CBC WITH DIFFERENTIAL/PLATELET
Abs Immature Granulocytes: 0.03 10*3/uL (ref 0.00–0.07)
Basophils Absolute: 0.1 10*3/uL (ref 0.0–0.1)
Basophils Relative: 1 %
Eosinophils Absolute: 0.3 10*3/uL (ref 0.0–0.5)
Eosinophils Relative: 3 %
HCT: 42.6 % (ref 36.0–46.0)
Hemoglobin: 14.4 g/dL (ref 12.0–15.0)
Immature Granulocytes: 0 %
Lymphocytes Relative: 23 %
Lymphs Abs: 1.9 10*3/uL (ref 0.7–4.0)
MCH: 35 pg — ABNORMAL HIGH (ref 26.0–34.0)
MCHC: 33.8 g/dL (ref 30.0–36.0)
MCV: 103.4 fL — ABNORMAL HIGH (ref 80.0–100.0)
Monocytes Absolute: 0.8 10*3/uL (ref 0.1–1.0)
Monocytes Relative: 10 %
Neutro Abs: 5.1 10*3/uL (ref 1.7–7.7)
Neutrophils Relative %: 63 %
Platelets: 218 10*3/uL (ref 150–400)
RBC: 4.12 MIL/uL (ref 3.87–5.11)
RDW: 14.4 % (ref 11.5–15.5)
WBC: 8.2 10*3/uL (ref 4.0–10.5)
nRBC: 0 % (ref 0.0–0.2)

## 2020-05-10 LAB — BASIC METABOLIC PANEL
Anion gap: 8 (ref 5–15)
BUN: 8 mg/dL (ref 6–20)
CO2: 28 mmol/L (ref 22–32)
Calcium: 8.9 mg/dL (ref 8.9–10.3)
Chloride: 106 mmol/L (ref 98–111)
Creatinine, Ser: 0.88 mg/dL (ref 0.44–1.00)
GFR, Estimated: >60 mL/min (ref >60)
Glucose, Bld: 98 mg/dL (ref 70–99)
Potassium: 3.6 mmol/L (ref 3.5–5.1)
Sodium: 142 mmol/L (ref 135–145)

## 2020-05-10 MED ORDER — LIDOCAINE HCL (PF) 1 % IJ SOLN
30.0000 mL | Freq: Once | INTRAMUSCULAR | Status: AC
  Administered 2020-05-10: 30 mL
  Filled 2020-05-10: qty 30

## 2020-05-10 MED ORDER — POVIDONE-IODINE 10 % EX SOLN
CUTANEOUS | Status: AC
  Filled 2020-05-10: qty 15

## 2020-05-10 MED ORDER — LIDOCAINE-EPINEPHRINE-TETRACAINE (LET) TOPICAL GEL
3.0000 mL | Freq: Once | TOPICAL | Status: AC
  Administered 2020-05-10: 3 mL via TOPICAL
  Filled 2020-05-10: qty 3

## 2020-05-10 MED ORDER — SODIUM CHLORIDE 0.9 % IV BOLUS
1000.0000 mL | Freq: Once | INTRAVENOUS | Status: AC
  Administered 2020-05-10: 1000 mL via INTRAVENOUS

## 2020-05-10 MED ORDER — IOHEXOL 350 MG/ML SOLN
75.0000 mL | Freq: Once | INTRAVENOUS | Status: AC | PRN
  Administered 2020-05-10: 75 mL via INTRAVENOUS

## 2020-05-10 MED ORDER — HYDROCODONE-ACETAMINOPHEN 5-325 MG PO TABS
2.0000 | ORAL_TABLET | Freq: Once | ORAL | Status: AC
  Administered 2020-05-10: 2 via ORAL
  Filled 2020-05-10: qty 2

## 2020-05-10 NOTE — ED Notes (Signed)
Pressure dressing applied to neck. Pt tolerated well.
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
## 2020-05-10 NOTE — ED Notes (Signed)
Pt sister Lattie Haw called and given per pt request.
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
## 2020-05-10 NOTE — Discharge Instructions (Signed)
Suture removal in 7 days.  Return here tomorrow for recheck and ct angio head
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
## 2021-07-25 IMAGING — CT CT ANGIO NECK
2 of 7 series · 8 of 33 positions shown · IV contrast (omnipaque)
Comparison: None.

CLINICAL DATA: Neck trauma, midline tenderness. Laceration of the
neck.

EXAM:
CT ANGIOGRAPHY NECK
TECHNIQUE: Multidetector CT imaging of the neck was performed using the
standard protocol during bolus administration of intravenous
contrast. Multiplanar CT image reconstructions and MIPs were
obtained to evaluate the vascular anatomy. Carotid stenosis
measurements (when applicable) are obtained utilizing NASCET
criteria, using the distal internal carotid diameter as the
denominator.
CONTRAST:  75mL OMNIPAQUE IOHEXOL 350 MG/ML SOLN

[Series 5: cta neck · axial · 0.43mm/px · z∈[-138,-56]mm · 2 of 123 slices shown]
[im 41/123  soft-tissue]
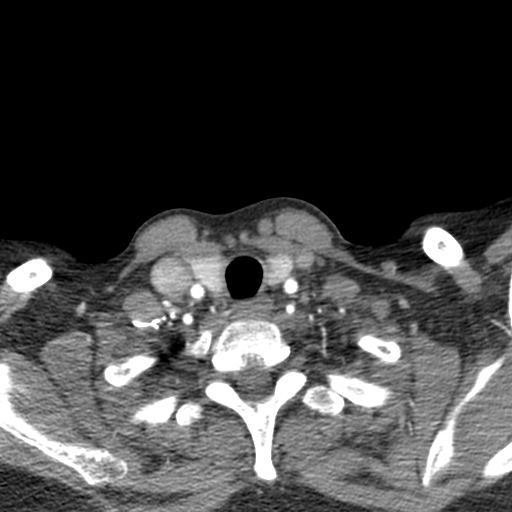
[im 82/123  soft-tissue]
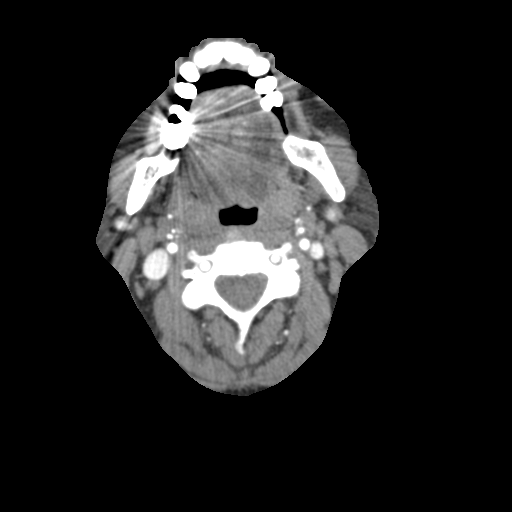

[Series 7: ax thin · axial · 0.30mm/px · z∈[-183,-8]mm · 6 of 247 slices shown]
[im 36/247  soft-tissue]
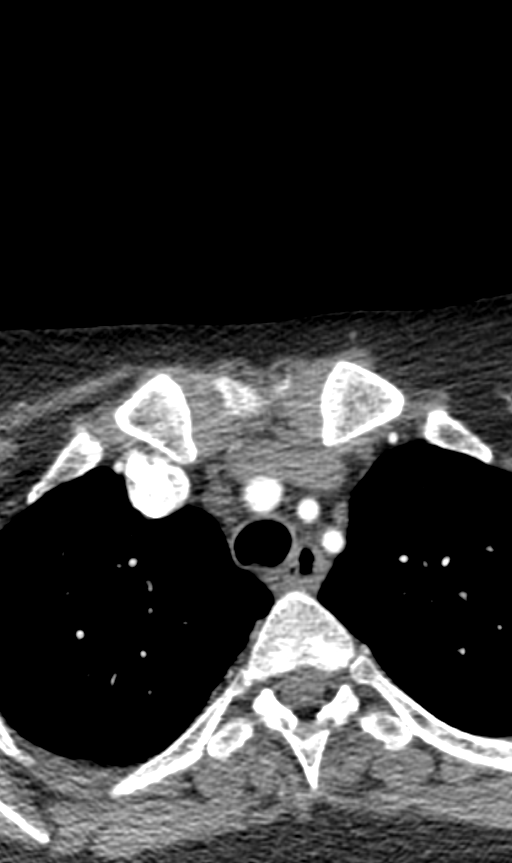
[im 71/247  bone]
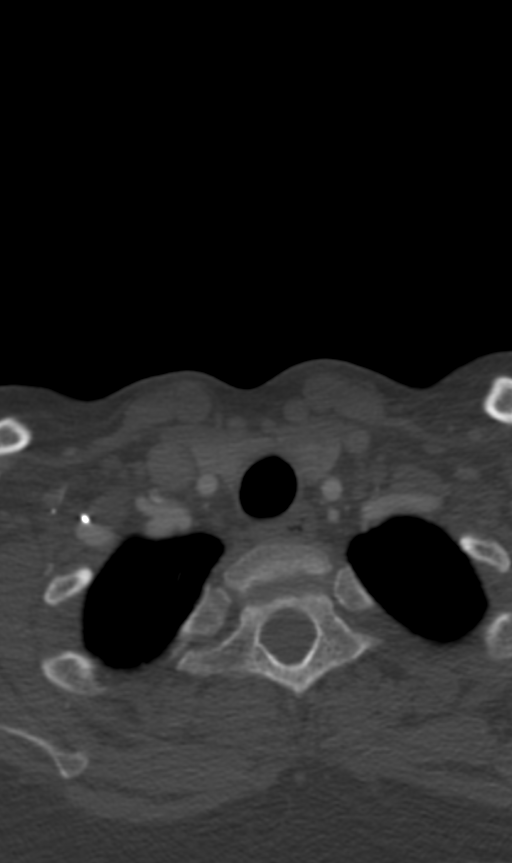
[im 106/247  soft-tissue]
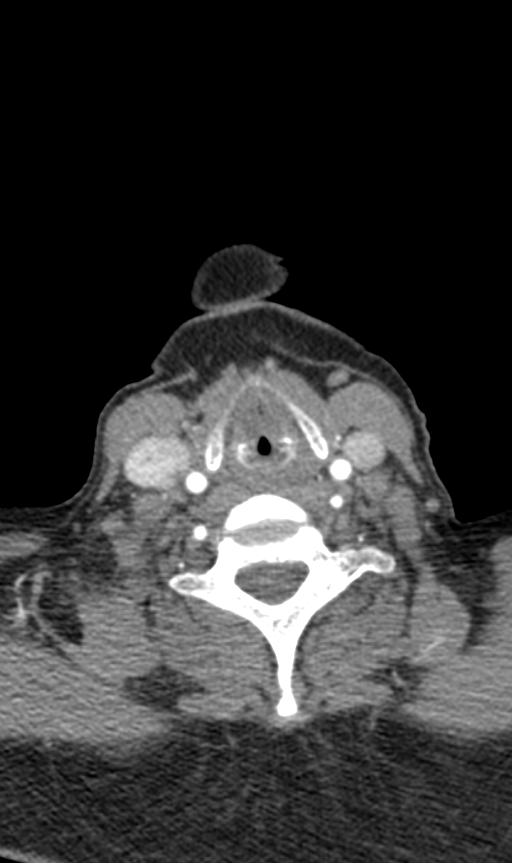
[im 141/247  bone]
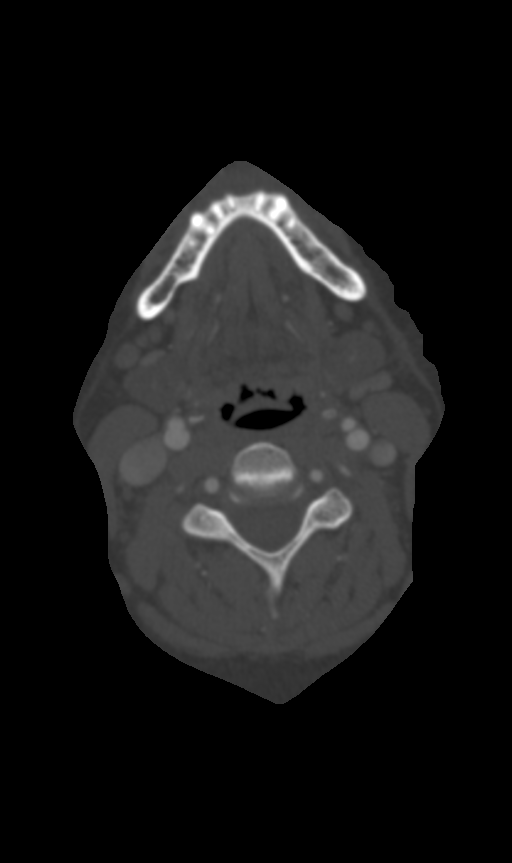
[im 176/247  soft-tissue]
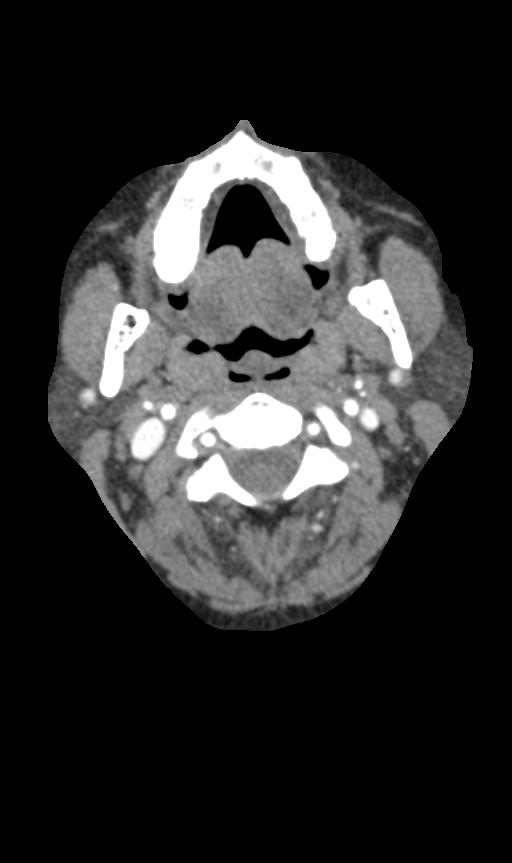
[im 211/247  bone]
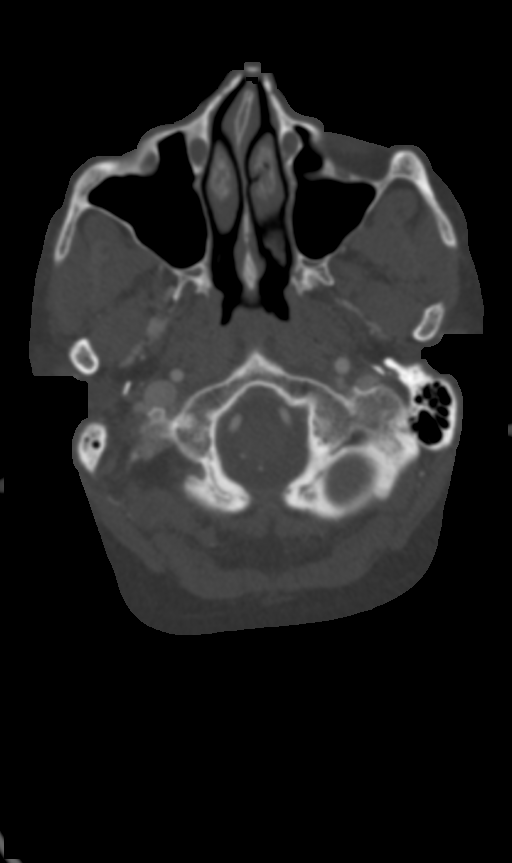

[8 of 33 positions shown; findings below may reference images not displayed]

FINDINGS: Aortic arch: Imaged portion shows no evidence of aneurysm or
dissection. No significant stenosis of the major arch vessel
origins.

Right carotid system: No evidence of dissection, stenosis (50% or
greater) or occlusion.

Left carotid system: No evidence of dissection, stenosis (50% or
greater) or occlusion.Partially imaged left paraclinoid ICA
aneurysm, measuring up to at least 8 mm.

Vertebral arteries: Codominant. No evidence of dissection, stenosis
(50% or greater) or occlusion. Fenestration of the proximal basilar
artery.

Skeleton: No evidence of acute abnormality.

Other neck: Contusion/laceration of the left neck

Upper chest: No acute findings.  Right upper lobe cyst.
IMPRESSION: 1. Partially imaged left paraclinoid ICA aneurysm, measuring up to
at least 8 mm. Recommend CTA of the head to further evaluate.
2. No evidence of acute arterial abnormality or hemodynamically
significant stenosis in the neck.
3. Contusion/laceration of the left neck.
